# Patient Record
Sex: Male | Born: 1975 | Race: Black or African American | Hispanic: No | Marital: Single | Smoking: Former smoker
Health system: Southern US, Community
[De-identification: ages and names within clinical notes are randomized; demographics above are authoritative.]

## PROBLEM LIST (undated history)

## (undated) DIAGNOSIS — B019 Varicella without complication: Secondary | ICD-10-CM

## (undated) DIAGNOSIS — D649 Anemia, unspecified: Secondary | ICD-10-CM

## (undated) DIAGNOSIS — F209 Schizophrenia, unspecified: Secondary | ICD-10-CM

## (undated) DIAGNOSIS — E785 Hyperlipidemia, unspecified: Secondary | ICD-10-CM

## (undated) DIAGNOSIS — F1910 Other psychoactive substance abuse, uncomplicated: Secondary | ICD-10-CM

## (undated) DIAGNOSIS — F32A Depression, unspecified: Secondary | ICD-10-CM

## (undated) DIAGNOSIS — F329 Major depressive disorder, single episode, unspecified: Secondary | ICD-10-CM

## (undated) HISTORY — DX: Other psychoactive substance abuse, uncomplicated: F19.10

## (undated) HISTORY — DX: Hyperlipidemia, unspecified: E78.5

## (undated) HISTORY — DX: Anemia, unspecified: D64.9

## (undated) HISTORY — DX: Major depressive disorder, single episode, unspecified: F32.9

## (undated) HISTORY — DX: Varicella without complication: B01.9

## (undated) HISTORY — DX: Depression, unspecified: F32.A

## (undated) HISTORY — DX: Schizophrenia, unspecified: F20.9

## (undated) HISTORY — PX: CYST EXCISION: SHX5701

---

## 2014-03-21 ENCOUNTER — Ambulatory Visit (INDEPENDENT_AMBULATORY_CARE_PROVIDER_SITE_OTHER): Payer: Medicare FFS | Admitting: Family

## 2014-03-21 ENCOUNTER — Encounter: Payer: Self-pay | Admitting: Family

## 2014-03-21 ENCOUNTER — Telehealth: Payer: Self-pay | Admitting: Family

## 2014-03-21 ENCOUNTER — Other Ambulatory Visit (INDEPENDENT_AMBULATORY_CARE_PROVIDER_SITE_OTHER): Payer: Medicare FFS

## 2014-03-21 ENCOUNTER — Ambulatory Visit (INDEPENDENT_AMBULATORY_CARE_PROVIDER_SITE_OTHER)
Admission: RE | Admit: 2014-03-21 | Discharge: 2014-03-21 | Disposition: A | Discharge status: Home / Self Care | Payer: Medicare FFS | Source: Ambulatory Visit | Attending: Family | Admitting: Family

## 2014-03-21 VITALS — BP 130/90 | HR 63 | Temp 98.4°F | Ht 66.5 in | Wt 179.8 lb

## 2014-03-21 DIAGNOSIS — Z8042 Family history of malignant neoplasm of prostate: Secondary | ICD-10-CM

## 2014-03-21 DIAGNOSIS — M79672 Pain in left foot: Secondary | ICD-10-CM

## 2014-03-21 DIAGNOSIS — Z125 Encounter for screening for malignant neoplasm of prostate: Secondary | ICD-10-CM

## 2014-03-21 LAB — PSA: PSA: 0.47 ng/mL (ref 0.10–4.00)

## 2014-03-21 NOTE — Progress Notes (Signed)
Pre visit review using our clinic review tool, if applicable. No additional management support is needed unless otherwise documented below in the visit note. 

## 2014-03-21 NOTE — Telephone Encounter (Signed)
Please call to notify patient that his PSA results were within normal limits, so no further action is needed at this time. We will continue to monitor in future unless symptoms develop sooner. Also the x-rays of his foot were also normal showing no fracture. I have put in a referral to Sports Medicine as we discussed during his appointment. He should he back in about a week, if he does not, please let us know.

## 2014-03-21 NOTE — Progress Notes (Signed)
Subjective:    Patient ID: Justin Sanchez, male    DOB: 02/05/1976, 38 y.o.   MRN: [id number]  HPI  Justin Sanchez is a 38 y/o male who presents today to establish care.   1) Prostate Cancer: He recently found out from a family member that all the men in family have a history of prostate cancer and would like to have his PSA tested. Currently denies any urinary symptoms including urgency, frequency, changes in stream, or dribbling.   2) Foot - Indicates he was playing basketball a couple of weeks ago and a few days later noted left sided sharp dorsolateral foot pain. Pain has is severe enough at times when he gets out of bed to "drop him down." He feels it with most steps. Denies performing any treatments or taking any medications. Denies hearing/feeling any pops, snaps or sensations.    No Known Allergies  Current outpatient prescriptions:IRON PO, Take by mouth., Disp: , Rfl: ;  OLANZapine (ZYPREXA) 10 MG tablet, , Disp: , Rfl: ;  simvastatin (ZOCOR) 20 MG tablet, , Disp: , Rfl:   Past Medical History  Diagnosis Date  . Hyperlipidemia   . Chicken pox   . Schizophrenia     2002 - well controlled  . Anemia     Iron deficinecy - taking iron  . Depression     Long time ago - denies currently for 10 years  . Alcohol abuse     Sober since 2003  . Drug abuse     Family History  Problem Relation Age of Onset  . Prostate cancer Maternal Uncle     History   Social History  . Marital Status: Married    Spouse Name: [name]    Number of Children: 1  . Years of Education: 14   Occupational History  . Other     Works [employer]   Social History Main Topics  . Smoking status: Former Smoker -- 1.50 packs/day for 6 years  . Smokeless tobacco: Never Used  . Alcohol Use: No  . Drug Use: No  . Sexual Activity: Yes    Birth Control/ Protection: Condom   Other Topics Concern  . Not on file   Social History Narrative   Born in [place], [place]   Raised [place], [place]     Completed some college   Family brought to [facility]      Cycle and exercise, read,    Diet: eats a little bit of everything, stays away from fried food - eats lots of vegetables.    Review of Systems  Constitutional: Negative for fever and chills.  Respiratory: Negative for cough, chest tightness and shortness of breath.   Cardiovascular: Negative for chest pain, palpitations and leg swelling.  Genitourinary: Negative for urgency, frequency, decreased urine volume and difficulty urinating.  Musculoskeletal: Positive for arthralgias (Left foot pain as described above).  Psychiatric/Behavioral: Negative for suicidal ideas, self-injury and dysphoric mood. The patient is not nervous/anxious.       Objective:   Physical Exam  Constitutional: He appears well-developed and well-nourished. No distress.  Cardiovascular: Normal rate, regular rhythm and normal heart sounds.   Pulmonary/Chest: Effort normal and breath sounds normal.  Musculoskeletal:       Feet:  No obvious deformity or discoloration of left foot noted. Strength 5+ in all directions. Tuning fork negative. Ant drawer, talar tilt negative.          BP 130/90  Pulse 63  Temp(Src)  98.4 F (36.9 C) (Oral)  Ht 5' 6.5" (1.689 m)  Wt 179 lb 12.8 oz (81.557 kg)  BMI 28.59 kg/m2  SpO2 95%   Assessment & Plan:

## 2014-03-21 NOTE — Patient Instructions (Signed)
Thank you for choosing [facility].  Summary/Instructions:   Please stop by the lab prior to leaving to get your blood work  Please stop by radiology to have your foot x-ray completed  Plan to follow up in about a month for your annual physical exam.  Thank you for enrolling in MyChart. Please follow the instructions below to securely access your online medical record. MyChart allows you to send messages to your doctor, view your test results, renew your prescriptions, schedule appointments, and more.  How Do I Sign Up? 1. In your Internet browser, go to [email or web address]. 2. Click on the New  User? link in the Sign In box.  3. Enter your MyChart Access Code exactly as it appears below. You will not need to use this code after you have completed the sign-up process. If you do not sign up before the expiration date, you must request a new code. MyChart Access Code: [email or web address] Expires: 05/20/2014 11:33 AM  4. Enter the last four digits of your Social Security Number (xxxx) and Date of Birth (mm/dd/yyyy) as indicated and click Next. You will be taken to the next sign-up page. 5. Create a MyChart ID. This will be your MyChart login ID and cannot be changed, so think of one that is secure and easy to remember. 6. Create a MyChart password. You can change your password at any time. 7. Enter your Password Reset Question and Answer and click Next. This can be used at a later time if you forget your password.  8. Select your communication preference, and if applicable enter your e-mail address. You will receive e-mail notification when new information is available in MyChart by choosing to receive e-mail notifications and filling in your e-mail. 9. Click Sign In. You can now view your medical record.   Additional Information If you have questions, you can email [email or web address] or call [phone] to talk to our MyChart staff. Remember, MyChart is  NOT to be used for urgent needs. For medical emergencies, dial 911.   Prostate-Specific Antigen The prostate-specific antigen (PSA) test is a blood test. It can be used to help diagnose prostate cancer in men whose physical exam results suggest the presence of prostate cancer. Some factors interfere with the results of the PSA. The factors listed below will either increase or decrease the PSA levels. They are: Prescriptions used for male baldness. Some herbs. Active prostate infection. Prior instrumentation or urinary catheterization. Ejaculation up to 2 days prior to testing. A noncancerous enlargement of the prostate. Inflammation of the prostate. Active urinary tract infection. If your test results are high (elevated), your health care provider will discuss the results with you. He or she will also let you know if more evaluation is needed. PREPARATION FOR TEST No preparation or fasting is necessary. NORMAL FINDINGS Less than 4 ng/mL or less than 774mcg/L (SI units) Ranges for normal findings may vary among different labs and hospitals. You should always check with your health care provider after having lab work or other tests done to discuss the meaning of your test results and whether your values are considered within normal limits. MEANING OF TEST  A normal value means prostate cancer is less likely. The chance of having prostate cancer increases if the value is between 4 ng/mL and 10 ng/mL. However, further testing will be needed. Values above 10 ng/mL suggest that there is a much higher chance of having prostate cancer (if the above situations that raise PSA  are not present). Your health care provider will go over your test results with you and discuss the importance of this test. If this value is elevated, your health care provider may recommend further testing or evaluation. OBTAINING THE TEST RESULTS It is your responsibility to obtain your test results. Ask the lab or department  performing the test when and how you will get your results. Document Released: 07/05/2004 Document Revised: 06/07/2013 Document Reviewed: 01/08/2007 [facility] Patient Information 2015 [place], [place]. This information is not intended to replace advice given to you by your health care provider. Make sure you discuss any questions you have with your health care provider.    Prostate Cancer  Prostate cancer is the abnormal growth of cells in your prostate gland. Your prostate gland is involved in the production of semen. It is located below your bladder and in front of your rectum. A normal prostate gland is the size of a walnut and surrounds the tube that carries urine from the bladder (urethra).  RISK FACTORS  Age older than 65 years.  African American race.  Obesity.  Family history of prostate cancer.  Family history of breast cancer. SIGNS AND SYMPTOMS   Frequent urination.  Weak or interrupted flow of urine.  Difficulty starting or stopping urination.  Inability to urinate.  Painful or burning urination.  Painful ejaculation.  Blood in urine or semen.  Persistent pain or discomfort in the lower back, lower abdomen, hips, or upper thighs.  Difficulty getting an erection.  Difficulty emptying your bladder completely. DIAGNOSIS  Prostate cancer can be diagnosed by a digital rectal exam, prostate-specific antigen (PSA) blood test, transrectal ultrasonography, and then a biopsy to test a tissue sample. Usually 8-12 samples are taken. The tissue is sent to a specialist who looks at tissues and cells (pathologist). If cancer is diagnosed, the next step is to stage the cancer. This means it is put in a category based on how far the cancer has spread. This is important for helping your health care providers plan appropriate treatment. The different stages of prostate cancer are as follows:  Stage I. Cancer is found in the prostate only. It cannot be felt during a digital rectal  exam and is not visible by imaging. It is usually found accidentally, such as during surgery for other prostate problems.  Stage II. Cancer is more advanced than in stage I but has not spread outside the prostate.  Stage III. Cancer has spread beyond the outer layer of the prostate to nearby tissues. Cancer may be found in the seminal vesicles.  Stage IV. Cancer has spread to lymph nodes or to other parts of the body. The cancer may have spread to the bladder, rectum, bones, liver, or lungs. Prostate cancer often spreads to the bones. Imaging scans, such as a bone scan, CT, PET, or MRI, are done to help in the staging process. TREATMENT  Treatments such as surgery, medicines, and radiation may be recommended based on the stage of the cancer and other factors. Once cancer of the prostate has been diagnosed, your health care provider will discuss your treatment with you. Your health care provider will help you decide on the best course of treatment. Treatment often depends on your age, health, and other risk factors. The more common methods of treatment are:  Observation for early stage prostate cancer.  Open surgery. This involves a surgery to remove the prostate.  Laparoscopic prostatectomy to remove the prostate and lymph nodes.  Robotic prostatectomy to remove  the prostate and lymph nodes.  External beam radiation, which aims beams of radiation from outside the body at the prostate.  Internal radiation (brachytherapy), which uses radioactive needles, 40-100 pellets (seeds), wires, or catheters implanted directly into the prostate gland.  High-intensity, focused ultrasonography to destroy cancer cells.  Cryosurgery to freeze and destroy prostate cancer cells.  Chemotherapy medicines to stop the growth of cancer cells either by killing them or by stopping them from multiplying.  Hormone treatment. Medicines that stop your body from producing testosterone, or medicines that block  testosterone from reaching cancer cells.  Orchiectomy. This is surgery to remove your testicles. HOME CARE INSTRUCTIONS  Take medicines only as directed by your health care provider.  Maintain a healthy diet.  Get plenty of sleep.  Consider joining a support group. This may help you learn to cope with the stress of having prostate cancer.  Seek advice to help you manage treatment side effects.  Keep all follow-up visits as directed by your health care provider.  Inform your cancer specialist if you are admitted to the hospital.  Continue sexual expression. If you experience erectile dysfunction, your natural reaction will be to pull away and avoid all sexual contact. Consider touching, holding, hugging, and caressing as ways to continue sharing sexuality with your partner. SEEK MEDICAL CARE IF:  You have trouble urinating.  You have blood in your urine.  You have trouble getting an erection.  You have pain in your hips, back, or chest. SEEK IMMEDIATE MEDICAL CARE IF:  You have weakness or numbness in your legs.  You have involuntary loss of urine or stool (incontinence). Document Released: 06/02/2005 Document Revised: 10/17/2013 Document Reviewed: 11/19/2012 [facility] Patient Information 2015 [place], [place]. This information is not intended to replace advice given to you by your health care provider. Make sure you discuss any questions you have with your health care provider.

## 2014-03-21 NOTE — Assessment & Plan Note (Signed)
No obvious deformity / discoloration. Most likely foot sprain, cannot rule out fx. Obtain foot x-rays, NSAIDs, possible refer to Sports Medicine.

## 2014-03-21 NOTE — Assessment & Plan Note (Signed)
Denies any current signs/symptoms. Will obtain PSA. Information on prostate cancer and PSA test provided to patient.

## 2014-03-22 NOTE — Telephone Encounter (Signed)
lmtcb

## 2014-03-23 NOTE — Telephone Encounter (Signed)
Patient called for lab results, leave a detailed message on his phone.  Thank You

## 2014-03-23 NOTE — Telephone Encounter (Signed)
This patient was routed to me by mistake, please follow up or route to the appropriate person.  Thank you.

## 2014-03-23 NOTE — Telephone Encounter (Signed)
lmtcb

## 2014-03-23 NOTE — Telephone Encounter (Signed)
Pt.notified

## 2014-03-31 ENCOUNTER — Ambulatory Visit: Payer: Medicare FFS | Admitting: Family Medicine

## 2014-04-04 ENCOUNTER — Telehealth: Payer: Self-pay | Admitting: *Deleted

## 2014-04-04 NOTE — Telephone Encounter (Signed)
Pt wanting to get the actual value on his PSA. Gave him results...Raechel Chute/lmb

## 2014-04-05 ENCOUNTER — Ambulatory Visit (INDEPENDENT_AMBULATORY_CARE_PROVIDER_SITE_OTHER): Payer: Medicare FFS | Admitting: Family Medicine

## 2014-04-05 ENCOUNTER — Encounter: Payer: Self-pay | Admitting: Family Medicine

## 2014-04-05 ENCOUNTER — Other Ambulatory Visit (INDEPENDENT_AMBULATORY_CARE_PROVIDER_SITE_OTHER): Payer: Medicare FFS

## 2014-04-05 VITALS — BP 118/70 | HR 65 | Ht 66.0 in | Wt 178.0 lb

## 2014-04-05 DIAGNOSIS — M79672 Pain in left foot: Secondary | ICD-10-CM

## 2014-04-05 DIAGNOSIS — S92342A Displaced fracture of fourth metatarsal bone, left foot, initial encounter for closed fracture: Secondary | ICD-10-CM | POA: Insufficient documentation

## 2014-04-05 NOTE — Assessment & Plan Note (Signed)
Basal fourth metatarsal. We discussed icing protocol, Cam Walker, home exercises, as well as vitamin D supplementation and Tylenol for pain relief. Patient will avoid anti-inflammatories due to decreasing bone healing. Patient followup in 2 weeks to make sure that he continues to improve. We discussed having patient doing seated work only at this time patient will try to work in the [employer] Walker.

## 2014-04-05 NOTE — Progress Notes (Signed)
  Tawana ScaleZach Ilean Spradlin D.O. [facility] [address] [place], [place] [address] Phone: [phone] Subjective:    I'm seeing this patient by the request  of:  Jeanine Luzalone, Gregory, FNP   CC: left foot pain.   OZH:YQMVHQIONGHPI:Subjective Ines BloomerShawn Mayford KnifeWilliams is a 38 y.o. male coming in with complaint of left foot pain. Patient states he has had this pain for approximately 1 month. Patient does not remember any true injury but was playing basketball and had mild sensation in his left foot. Patient states that now the pain is increased at any time he is walking on the foot he is in pain. States it is on the dorsal lateral aspect of the foot. Denies any numbness or tingling. States that if he is not walking on it he feels better. States that the severity is 7/10. Denies any weakness of the foot or ankle. Has never injured this foot previously. 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 X-rays were reviewed by me. When in for any image patient does have a small extra-articular spiral fracture noted then may extend into the metatarsal joint. No significant displacement noted.    Past medical history, social, surgical and family history all reviewed in electronic medical record.   Review of Systems: No headache, visual changes, nausea, vomiting, diarrhea, constipation, dizziness, abdominal pain, skin rash, fevers, chills, night sweats, weight loss, swollen lymph nodes, body aches, joint swelling, muscle aches, chest pain, shortness of breath, mood changes.   Objective Blood pressure 118/70, pulse 65, height 5\' 6"  (1.676 m), weight 178 lb (80.74 kg), SpO2 97.00%.  General: No apparent distress alert and oriented x3 mood and affect normal, dressed appropriately.  HEENT: Pupils equal, extraocular movements intact  Respiratory: Patient's speak in full sentences and does not appear short of breath  Cardiovascular: No lower extremity edema, non tender, no erythema  Skin: Warm dry intact with no signs of  infection or rash on extremities or on axial skeleton.  Abdomen: Soft nontender  Neuro: Cranial nerves II through XII are intact, neurovascularly intact in all extremities with 2+ DTRs and 2+ pulses.  Lymph: No lymphadenopathy of posterior or anterior cervical chain or axillae bilaterally.  Gait normal with good balance and coordination.  MSK:  Non tender with full range of motion and good stability and symmetric strength and tone of shoulders, elbows, wrist, hip, knee and ankles bilaterally.  Foot exam shows no significant gross deformity. Patient is severely tender over the base of the fourth metatarsal. Patient is minimally tender over the base of the fifth metatarsal as well. No crepitus noted. Neurovascularly intact distally. Good capillary refill.  Muscular skeletal ultrasound was performed and interpreted by Antoine PrimasSMITH, Kirsi Hugh, M  Today.  Limited ultrasound of the base of the fourth metatarsal shows the patient does have a true fracture that is minimally displaced at the base of the fourth metatarsal that is extra-articular. Increasing Doppler flow as well as hypoechoic changes also noted.  Impression: Fourth metatarsal fracture noted.    Impression and Recommendations:     This case required medical decision making of moderate complexity.

## 2014-04-05 NOTE — Patient Instructions (Signed)
Good to see you Ice bath 20 minutes at night Tylenol 650mg  3 times daily.  Pennsaid up to 2 times daily if needed.  Vitamin D 2000 IU daily.  Rigid sole shoes until we get the boot and we will call you See you again in 2-3 weeks.

## 2014-04-06 ENCOUNTER — Telehealth: Payer: Self-pay | Admitting: *Deleted

## 2014-04-06 NOTE — Telephone Encounter (Signed)
Left msg on triage wanting to know is his boot ready for pick-up...Raechel Chute/lmb

## 2014-04-07 NOTE — Telephone Encounter (Signed)
Nothing yet.  We will keep him informed.

## 2014-04-07 NOTE — Telephone Encounter (Signed)
Notified pt with md response.../lmb 

## 2014-04-11 ENCOUNTER — Telehealth: Payer: Self-pay | Admitting: Family Medicine

## 2014-04-11 NOTE — Telephone Encounter (Signed)
Patient would like a call back in regards to a boot he is suppose to get but not gotten yet.

## 2014-04-11 NOTE — Telephone Encounter (Signed)
Spoke to pt, let him know that his boot is suppose to be in tomorrow, i told him i would call him as soon as it comes in.

## 2014-04-12 ENCOUNTER — Encounter: Payer: Self-pay | Admitting: *Deleted

## 2014-04-12 ENCOUNTER — Telehealth: Payer: Self-pay | Admitting: Family Medicine

## 2014-04-12 NOTE — Telephone Encounter (Signed)
Would like to know if boot came in.  He is requesting you leave vm on phone.

## 2014-04-12 NOTE — Telephone Encounter (Signed)
Left msg on pt's vmail to let him know that the boot is here & he can come by to pick it up.

## 2014-04-20 ENCOUNTER — Ambulatory Visit (INDEPENDENT_AMBULATORY_CARE_PROVIDER_SITE_OTHER): Payer: Medicare FFS | Admitting: Family Medicine

## 2014-04-20 ENCOUNTER — Encounter: Payer: Self-pay | Admitting: Family Medicine

## 2014-04-20 ENCOUNTER — Other Ambulatory Visit (INDEPENDENT_AMBULATORY_CARE_PROVIDER_SITE_OTHER): Payer: Medicare FFS

## 2014-04-20 VITALS — BP 132/78 | HR 71 | Ht 66.0 in | Wt 182.0 lb

## 2014-04-20 DIAGNOSIS — S92342A Displaced fracture of fourth metatarsal bone, left foot, initial encounter for closed fracture: Secondary | ICD-10-CM

## 2014-04-20 MED ORDER — VITAMIN D (ERGOCALCIFEROL) 1.25 MG (50000 UNIT) PO CAPS: 50000.0000 [IU] | ORAL_CAPSULE | ORAL | Status: DC

## 2014-04-20 NOTE — Progress Notes (Signed)
  Tawana ScaleZach Antar Sanchez D.O. [facility] [address] [place], [name] Phone: [phone] Subjective:     CC: left foot pain.   BJY:NWGNFAOZHYHPI:Subjective Justin Sanchez is a 38 y.o. male coming in with complaint of left foot pain. Patient is following up for a fourth metatarsal fracture. Patient is mostly wearing a [employer] Walker boot and he states he does not do it on a regular basis. Patient did not get any over-the-counter vitamin D supplementation either. Patient states that it is improving but slowly. States that he is still unable to work secondary to the pain being in the walker itself. Denies any new symptoms.    Past medical history, social, surgical and family history all reviewed in electronic medical record.   Review of Systems: No headache, visual changes, nausea, vomiting, diarrhea, constipation, dizziness, abdominal pain, skin rash, fevers, chills, night sweats, weight loss, swollen lymph nodes, body aches, joint swelling, muscle aches, chest pain, shortness of breath, mood changes.   Objective Blood pressure 132/78, pulse 71, height 5\' 6"  (1.676 m), weight 182 lb (82.555 kg), SpO2 97 %.  General: No apparent distress alert and oriented x3 mood and affect normal, dressed appropriately.  HEENT: Pupils equal, extraocular movements intact  Respiratory: Patient's speak in full sentences and does not appear short of breath  Cardiovascular: No lower extremity edema, non tender, no erythema  Skin: Warm dry intact with no signs of infection or rash on extremities or on axial skeleton.  Abdomen: Soft nontender  Neuro: Cranial nerves II through XII are intact, neurovascularly intact in all extremities with 2+ DTRs and 2+ pulses.  Lymph: No lymphadenopathy of posterior or anterior cervical chain or axillae bilaterally.  Gait normal with good balance and coordination.  MSK:  Non tender with full range of motion and good stability and symmetric strength and tone of shoulders, elbows, wrist,  hip, knee and ankles bilaterally.  Foot exam shows no significant gross deformity. Patient is tender over the base of the fourth metatarsal with mild improvement from previous exam. Patient is minimally tender over the base of the fifth metatarsal as well. No crepitus noted. Neurovascularly intact distally. Good capillary refill.  Muscular skeletal ultrasound was performed and interpreted by Justin Sanchez, Justin Sanchez, M  Today.  Limited ultrasound of the base of the fourth metatarsal shows the patient does have a true fracture that is no longer displaced, mild callus formation noted.     Impression: Fourth metatarsal fracture noted with moderate healing.     Impression and Recommendations:     This case required medical decision making of moderate complexity.

## 2014-04-20 NOTE — Assessment & Plan Note (Signed)
Patient has been somewhat noncompliant encourage him to wear the boot on a more regular basis. We discussed doing the icing as well and patient was given a prescription for weekly vitamin D supplementation to help with the healing. Patient though has been out of work and needs to return to work in the near future. Discussed with him the possibility of coming back in 2 weeks for further evaluation. Patient has more of a callus formation we may be able to get him back into a boot.

## 2014-04-20 NOTE — Patient Instructions (Signed)
Good to see you Ice is your friend Bonita QuinYou are doing well Vitamin D weekly for next 3 months.  Wear boot daily next 2 weeks.  Then transition to shoe in house if no pain.  Then would love to see you in 3-4 weeks to make sure  When coming in if you can get xray before, so come in 20 minutes early.

## 2014-04-24 ENCOUNTER — Encounter: Payer: Self-pay | Admitting: Family

## 2014-04-24 ENCOUNTER — Ambulatory Visit (INDEPENDENT_AMBULATORY_CARE_PROVIDER_SITE_OTHER): Payer: Medicare FFS | Admitting: Family

## 2014-04-24 VITALS — BP 114/72 | HR 70 | Temp 98.1°F | Resp 18 | Ht 66.0 in | Wt 183.4 lb

## 2014-04-24 DIAGNOSIS — E785 Hyperlipidemia, unspecified: Secondary | ICD-10-CM | POA: Insufficient documentation

## 2014-04-24 DIAGNOSIS — F209 Schizophrenia, unspecified: Secondary | ICD-10-CM | POA: Insufficient documentation

## 2014-04-24 DIAGNOSIS — F200 Paranoid schizophrenia: Secondary | ICD-10-CM

## 2014-04-24 MED ORDER — SIMVASTATIN 20 MG PO TABS: 20.0000 mg | ORAL_TABLET | Freq: Every day | ORAL | Status: DC

## 2014-04-24 MED ORDER — OLANZAPINE 10 MG PO TABS: 10.0000 mg | ORAL_TABLET | Freq: Every day | ORAL | Status: DC

## 2014-04-24 NOTE — Assessment & Plan Note (Signed)
Stable on Zyprexa. Continue Zyprexa 10 mg daily. Rx sent to pharmacy. Follow up in 6 months or if symptoms appear.

## 2014-04-24 NOTE — Assessment & Plan Note (Signed)
Stable on Zocor. Obtain lipid profile and hepatic panel when fasting. Continue current Zocor at 20 mg daily. Follow up pending lab results.

## 2014-04-24 NOTE — Patient Instructions (Addendum)
Thank you for choosing [facility].  Summary/Instructions:  Please stop by the lab in a fasting state at your convenience for your blood work.   Your prescriptions have been sent to your pharmacy.  Follow up in about 6 months or sooner if necessary.

## 2014-04-24 NOTE — Progress Notes (Signed)
   Subjective:    Patient ID: Justin HutchingShawn Tungate, male    DOB: 03/19/1976, 38 y.o.   MRN: [id number]  Chief Complaint  Patient presents with  . Follow-up    blood work?   HPI:  Justin HutchingShawn Mielke is a 38 y.o. male who presents today for follow up.  1) Schizophrenia - It continues to be under control and stable on the Zyprexa.  Wife believes he may have had some mania. No associated positive or negative symptoms of zyprexa noted.  2) Hyperlipidemia - currently maintained on simvastatin with no muscle aches or side effects.  No Known Allergies  Current Outpatient Prescriptions on File Prior to Visit  Medication Sig Dispense Refill  . IRON PO Take by mouth.    . OLANZapine (ZYPREXA) 10 MG tablet     . simvastatin (ZOCOR) 20 MG tablet     . Vitamin D, Ergocalciferol, (DRISDOL) 50000 UNITS CAPS capsule Take 1 capsule (50,000 Units total) by mouth every 7 (seven) days. 12 capsule 0   No current facility-administered medications on file prior to visit.   Past Medical History  Diagnosis Date  . Hyperlipidemia   . Chicken pox   . Schizophrenia     2002 - well controlled  . Anemia     Iron deficinecy - taking iron  . Depression     Long time ago - denies currently for 10 years  . Alcohol abuse     Sober since 2003  . Drug abuse     Review of Systems    See HPI  Objective:    BP 114/72 mmHg  Pulse 70  Temp(Src) 98.1 F (36.7 C) (Oral)  Resp 18  Ht 5\' 6"  (1.676 m)  Wt 183 lb 6.4 oz (83.19 kg)  BMI 29.62 kg/m2  SpO2 97% Nursing note and vital signs reviewed.  Physical Exam  Constitutional: He is oriented to person, place, and time. He appears well-developed and well-nourished. No distress.  Wearing a boot on his left foot.   Cardiovascular: Normal rate, regular rhythm, normal heart sounds and intact distal pulses.   Pulmonary/Chest: Effort normal and breath sounds normal.  Neurological: He is alert and oriented to person, place, and time.  Skin: Skin is warm and dry.    Psychiatric: He has a normal mood and affect. His behavior is normal. Judgment and thought content normal.      Assessment & Plan:

## 2014-04-24 NOTE — Progress Notes (Signed)
Pre visit review using our clinic review tool, if applicable. No additional management support is needed unless otherwise documented below in the visit note. 

## 2014-04-25 ENCOUNTER — Other Ambulatory Visit (INDEPENDENT_AMBULATORY_CARE_PROVIDER_SITE_OTHER): Payer: Medicare FFS

## 2014-04-25 ENCOUNTER — Telehealth: Payer: Self-pay | Admitting: Family

## 2014-04-25 DIAGNOSIS — E785 Hyperlipidemia, unspecified: Secondary | ICD-10-CM

## 2014-04-25 DIAGNOSIS — F200 Paranoid schizophrenia: Secondary | ICD-10-CM

## 2014-04-25 LAB — CBC
HCT: 40.1 % (ref 39.0–52.0)
Hemoglobin: 12.8 g/dL — ABNORMAL LOW (ref 13.0–17.0)
MCHC: 32 g/dL (ref 30.0–36.0)
MCV: 82 fl (ref 78.0–100.0)
Platelets: 217 10*3/uL (ref 150.0–400.0)
RBC: 4.89 Mil/uL (ref 4.22–5.81)
RDW: 14.3 % (ref 11.5–15.5)
WBC: 6.9 10*3/uL (ref 4.0–10.5)

## 2014-04-25 LAB — HEPATIC FUNCTION PANEL
ALBUMIN: 3.7 g/dL (ref 3.5–5.2)
ALK PHOS: 45 U/L (ref 39–117)
ALT: 36 U/L (ref 0–53)
AST: 28 U/L (ref 0–37)
BILIRUBIN DIRECT: 0.1 mg/dL (ref 0.0–0.3)
BILIRUBIN TOTAL: 1.1 mg/dL (ref 0.2–1.2)
Total Protein: 7.6 g/dL (ref 6.0–8.3)

## 2014-04-25 LAB — LIPID PANEL
CHOLESTEROL: 196 mg/dL (ref 0–200)
HDL: 30 mg/dL — ABNORMAL LOW (ref >39.00)
LDL Cholesterol: 148 mg/dL — ABNORMAL HIGH (ref 0–99)
NonHDL: 166
Total CHOL/HDL Ratio: 7
Triglycerides: 91 mg/dL (ref 0.0–149.0)
VLDL: 18.2 mg/dL (ref 0.0–40.0)

## 2014-04-25 LAB — TSH: TSH: 0.94 u[IU]/mL (ref 0.35–4.50)

## 2014-04-25 NOTE — Telephone Encounter (Signed)
Please call the patient and inform him that his blood work is mostly within the expected range. His LDL or bad cholesterol remains slightly elevated. Continue his current simvastatin and I would like him to follow up in about 6 months. If his cholesterol remains high we may need to increase his medication.

## 2014-04-26 NOTE — Telephone Encounter (Signed)
Let pt know that his LDL was elevated and to continue current simvastatin and follow up in 6 mo. He is aware that if there is no change that the dose could be increased.

## 2014-05-18 ENCOUNTER — Encounter: Payer: Self-pay | Admitting: *Deleted

## 2014-05-18 ENCOUNTER — Encounter: Payer: Self-pay | Admitting: Family Medicine

## 2014-05-18 ENCOUNTER — Ambulatory Visit (INDEPENDENT_AMBULATORY_CARE_PROVIDER_SITE_OTHER)
Admission: RE | Admit: 2014-05-18 | Discharge: 2014-05-18 | Disposition: A | Discharge status: Home / Self Care | Payer: Medicare FFS | Source: Ambulatory Visit | Attending: Family Medicine | Admitting: Family Medicine

## 2014-05-18 ENCOUNTER — Ambulatory Visit (INDEPENDENT_AMBULATORY_CARE_PROVIDER_SITE_OTHER): Payer: Medicare FFS | Admitting: Family Medicine

## 2014-05-18 VITALS — BP 102/72 | HR 65 | Ht 66.0 in | Wt 181.0 lb

## 2014-05-18 DIAGNOSIS — S92342A Displaced fracture of fourth metatarsal bone, left foot, initial encounter for closed fracture: Secondary | ICD-10-CM

## 2014-05-18 NOTE — Assessment & Plan Note (Signed)
Mostly healing at this time.  X-rays were ordered reviewed and interpreted by me today. No x-rays show any type of bony abnormality.  Patient at this time should be considered completely healed. At this point patient will start increasing his activity. Patient will come back if there is any other type of symptoms. With patient being on the Zyprexa I would consider continuing the vitamin D supplementation. Patient and will come back and see me on an as-needed basis as long as the pain does not return.

## 2014-05-18 NOTE — Progress Notes (Signed)
  Justin Sanchez D.O. [facility] [address] [place], [name] Phone: [phone] Subjective:     CC: left foot pain.   BJY:NWGNFAOZHYHPI:Subjective Justin Sanchez is a 38 y.o. male coming in with complaint of left foot pain. Patient is following up for a fourth metatarsal fracture. Patient has now been an issue for a longer time. Patient did start the vitamin D a very high dose. Overall patient has noticed improvement. Patient can go through his day without any significant pain. Mild dull aching pain at the end of the day though. Denies any nighttime awakening. Patient's goal is to get back to cycling.    Past medical history, social, surgical and family history all reviewed in electronic medical record.   Review of Systems: No headache, visual changes, nausea, vomiting, diarrhea, constipation, dizziness, abdominal pain, skin rash, fevers, chills, night sweats, weight loss, swollen lymph nodes, body aches, joint swelling, muscle aches, chest pain, shortness of breath, mood changes.   Objective Blood pressure 102/72, pulse 65, height 5\' 6"  (1.676 m), weight 181 lb (82.101 kg), SpO2 98 %.  General: No apparent distress alert and oriented x3 mood and affect normal, dressed appropriately.  HEENT: Pupils equal, extraocular movements intact  Respiratory: Patient's speak in full sentences and does not appear short of breath  Cardiovascular: No lower extremity edema, non tender, no erythema  Skin: Warm dry intact with no signs of infection or rash on extremities or on axial skeleton.  Abdomen: Soft nontender  Neuro: Cranial nerves II through XII are intact, neurovascularly intact in all extremities with 2+ DTRs and 2+ pulses.  Lymph: No lymphadenopathy of posterior or anterior cervical chain or axillae bilaterally.  Gait normal with good balance and coordination.  MSK:  Non tender with full range of motion and good stability and symmetric strength and tone of shoulders, elbows, wrist, hip,  knee and ankles bilaterally.  Foot exam shows no significant gross deformity. Patient is no longer tender on exam.  Muscular skeletal ultrasound was performed and interpreted by Antoine PrimasSMITH, ZACHARY, M  Today.  Limited ultrasound of the base of the fourth metatarsal shows the patient does have a true fracture that is no longer displaced, With near complete healing at this time.     Impression: continued healing noted.      Impression and Recommendations:     This case required medical decision making of moderate complexity.

## 2014-05-18 NOTE — Patient Instructions (Signed)
You are good to go Keep the rubber down Ice after riding.  Do what you can and continue the vitamin D  See me when you need me.

## 2014-05-26 ENCOUNTER — Encounter: Payer: Self-pay | Admitting: Family

## 2014-05-26 ENCOUNTER — Other Ambulatory Visit: Payer: Self-pay | Admitting: Family

## 2014-06-03 ENCOUNTER — Encounter: Payer: Self-pay | Admitting: Family

## 2014-06-05 ENCOUNTER — Other Ambulatory Visit: Payer: Self-pay

## 2014-06-05 MED ORDER — VALACYCLOVIR HCL 500 MG PO TABS
500.0000 mg | ORAL_TABLET | Freq: Two times a day (BID) | ORAL | Status: DC
Start: 2014-06-05 — End: 2017-05-29

## 2014-07-20 ENCOUNTER — Emergency Department (HOSPITAL_COMMUNITY)
Admission: EM | Admit: 2014-07-20 | Discharge: 2014-07-20 | Disposition: A | Discharge status: Home / Self Care | Payer: No Typology Code available for payment source | Attending: Emergency Medicine | Admitting: Emergency Medicine

## 2014-07-20 ENCOUNTER — Emergency Department (HOSPITAL_COMMUNITY): Payer: No Typology Code available for payment source

## 2014-07-20 ENCOUNTER — Encounter (HOSPITAL_COMMUNITY): Payer: Self-pay | Admitting: *Deleted

## 2014-07-20 DIAGNOSIS — Y9389 Activity, other specified: Secondary | ICD-10-CM | POA: Insufficient documentation

## 2014-07-20 DIAGNOSIS — S0990XA Unspecified injury of head, initial encounter: Secondary | ICD-10-CM | POA: Insufficient documentation

## 2014-07-20 DIAGNOSIS — F329 Major depressive disorder, single episode, unspecified: Secondary | ICD-10-CM | POA: Diagnosis not present

## 2014-07-20 DIAGNOSIS — S99922A Unspecified injury of left foot, initial encounter: Secondary | ICD-10-CM | POA: Diagnosis present

## 2014-07-20 DIAGNOSIS — E785 Hyperlipidemia, unspecified: Secondary | ICD-10-CM | POA: Diagnosis not present

## 2014-07-20 DIAGNOSIS — Z87891 Personal history of nicotine dependence: Secondary | ICD-10-CM | POA: Diagnosis not present

## 2014-07-20 DIAGNOSIS — D649 Anemia, unspecified: Secondary | ICD-10-CM | POA: Diagnosis not present

## 2014-07-20 DIAGNOSIS — Y92410 Unspecified street and highway as the place of occurrence of the external cause: Secondary | ICD-10-CM | POA: Diagnosis not present

## 2014-07-20 DIAGNOSIS — S6991XA Unspecified injury of right wrist, hand and finger(s), initial encounter: Secondary | ICD-10-CM | POA: Diagnosis not present

## 2014-07-20 DIAGNOSIS — Z8619 Personal history of other infectious and parasitic diseases: Secondary | ICD-10-CM | POA: Diagnosis not present

## 2014-07-20 DIAGNOSIS — F209 Schizophrenia, unspecified: Secondary | ICD-10-CM | POA: Diagnosis not present

## 2014-07-20 DIAGNOSIS — Z79899 Other long term (current) drug therapy: Secondary | ICD-10-CM | POA: Insufficient documentation

## 2014-07-20 DIAGNOSIS — Y998 Other external cause status: Secondary | ICD-10-CM | POA: Diagnosis not present

## 2014-07-20 MED ORDER — IBUPROFEN 800 MG PO TABS: 800.0000 mg | ORAL_TABLET | Freq: Three times a day (TID) | ORAL | Status: DC

## 2014-07-20 MED ORDER — METHOCARBAMOL 500 MG PO TABS: 500.0000 mg | ORAL_TABLET | Freq: Two times a day (BID) | ORAL | Status: DC

## 2014-07-20 NOTE — Discharge Instructions (Signed)
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
## 2014-07-27 ENCOUNTER — Ambulatory Visit (INDEPENDENT_AMBULATORY_CARE_PROVIDER_SITE_OTHER): Payer: Medicare FFS | Admitting: Family

## 2014-07-27 ENCOUNTER — Encounter: Payer: Self-pay | Admitting: Family

## 2014-07-27 DIAGNOSIS — M25531 Pain in right wrist: Secondary | ICD-10-CM

## 2014-07-27 NOTE — Patient Instructions (Signed)
Thank you for choosing [facility].  Summary/Instructions:  If your symptoms worsen or fail to improve, please contact our office for further instruction, or in case of emergency go directly to the emergency room at the closest medical facility.    Wrist Sprain with Rehab A sprain is an injury in which a ligament that maintains the proper alignment of a joint is partially or completely torn. The ligaments of the wrist are susceptible to sprains. Sprains are classified into three categories. Grade 1 sprains cause pain, but the tendon is not lengthened. Grade 2 sprains include a lengthened ligament because the ligament is stretched or partially ruptured. With grade 2 sprains there is still function, although the function may be diminished. Grade 3 sprains are characterized by a complete tear of the tendon or muscle, and function is usually impaired. SYMPTOMS   Pain tenderness, inflammation, and/or bruising (contusion) of the injury.  A "pop" or tear felt and/or heard at the time of injury.  Decreased wrist function. CAUSES  A wrist sprain occurs when a force is placed on one or more ligaments that is greater than it/they can withstand. Common mechanisms of injury include:  Catching a ball with you hands.  Repetitive and/ or strenuous extension or flexion of the wrist. RISK INCREASES WITH:  Previous wrist injury.  Contact sports (boxing or wrestling).  Activities in which falling is common.  Poor strength and flexibility.  Improperly fitted or padded protective equipment. PREVENTION  Warm up and stretch properly before activity.  Allow for adequate recovery between workouts.  Maintain physical fitness:  Strength, flexibility, and endurance.  Cardiovascular fitness.  Protect the wrist joint by limiting its motion with the use of taping, braces, or splints.  Protect the wrist after injury for 6 to 12 months. PROGNOSIS  The prognosis for wrist sprains depends on the  degree of injury. Grade 1 sprains require 2 to 6 weeks of treatment. Grade 2 sprains require 6 to 8 weeks of treatment, and grade 3 sprains require up to 12 weeks.  RELATED COMPLICATIONS   Prolonged healing time, if improperly treated or re-injured.  Recurrent symptoms that result in a chronic problem.  Injury to nearby structures (bone, cartilage, nerves, or tendons).  Arthritis of the wrist.  Inability to compete in athletics at a high level.  Wrist stiffness or weakness.  Progression to a complete rupture of the ligament. TREATMENT  Treatment initially involves resting from any activities that aggravate the symptoms, and the use of ice and medications to help reduce pain and inflammation. Your caregiver may recommend immobilizing the wrist for a period of time in order to reduce stress on the ligament and allow for healing. After immobilization it is important to perform strengthening and stretching exercises to help regain strength and a full range of motion. These exercises may be completed at home or with a therapist. Surgery is not usually required for wrist sprains, unless the ligament has been ruptured (grade 3 sprain). MEDICATION   If pain medication is necessary, then nonsteroidal anti-inflammatory medications, such as aspirin and ibuprofen, or other minor pain relievers, such as acetaminophen, are often recommended.  Do not take pain medication for 7 days before surgery.  Prescription pain relievers may be given if deemed necessary by your caregiver. Use only as directed and only as much as you need. HEAT AND COLD  Cold treatment (icing) relieves pain and reduces inflammation. Cold treatment should be applied for 10 to 15 minutes every 2 to 3 hours for  inflammation and pain and immediately after any activity that aggravates your symptoms. Use ice packs or massage the area with a piece of ice (ice massage).  Heat treatment may be used prior to performing the stretching and  strengthening activities prescribed by your caregiver, physical therapist, or athletic trainer. Use a heat pack or soak your injury in warm water. SEEK MEDICAL CARE IF:  Treatment seems to offer no benefit, or the condition worsens.  Any medications produce adverse side effects. EXERCISES RANGE OF MOTION (ROM) AND STRETCHING EXERCISES - Wrist Sprain  These exercises may help you when beginning to rehabilitate your injury. Your symptoms may resolve with or without further involvement from your physician, physical therapist or athletic trainer. While completing these exercises, remember:   Restoring tissue flexibility helps normal motion to return to the joints. This allows healthier, less painful movement and activity.  An effective stretch should be held for at least 30 seconds.  A stretch should never be painful. You should only feel a gentle lengthening or release in the stretched tissue. RANGE OF MOTION - Wrist Flexion, Active-Assisted  Extend your right / left elbow with your fingers pointing down.*  Gently pull the back of your hand towards you until you feel a gentle stretch on the top of your forearm.  Hold this position for __________ seconds. Repeat __________ times. Complete this exercise __________ times per day.  *If directed by your physician, physical therapist or athletic trainer, complete this stretch with your elbow bent rather than extended. RANGE OF MOTION - Wrist Extension, Active-Assisted  Extend your right / left elbow and turn your palm upwards.*  Gently pull your palm/fingertips back so your wrist extends and your fingers point more toward the ground.  You should feel a gentle stretch on the inside of your forearm.  Hold this position for __________ seconds. Repeat __________ times. Complete this exercise __________ times per day. *If directed by your physician, physical therapist or athletic trainer, complete this stretch with your elbow bent, rather than  extended. RANGE OF MOTION - Supination, Active  Stand or sit with your elbows at your side. Bend your right / left elbow to 90 degrees.  Turn your palm upward until you feel a gentle stretch on the inside of your forearm.  Hold this position for __________ seconds. Slowly release and return to the starting position. Repeat __________ times. Complete this stretch __________ times per day.  RANGE OF MOTION - Pronation, Active  Stand or sit with your elbows at your side. Bend your right / left elbow to 90 degrees.  Turn your palm downward until you feel a gentle stretch on the top of your forearm.  Hold this position for __________ seconds. Slowly release and return to the starting position. Repeat __________ times. Complete this stretch __________ times per day.  STRETCH - Wrist Flexion  Place the back of your right / left hand on a tabletop leaving your elbow slightly bent. Your fingers should point away from your body.  Gently press the back of your hand down onto the table by straightening your elbow. You should feel a stretch on the top of your forearm.  Hold this position for __________ seconds. Repeat __________ times. Complete this stretch __________ times per day.  STRETCH - Wrist Extension  Place your right / left fingertips on a tabletop leaving your elbow slightly bent. Your fingers should point backwards.  Gently press your fingers and palm down onto the table by straightening your elbow. You should  feel a stretch on the inside of your forearm.  Hold this position for __________ seconds. Repeat __________ times. Complete this stretch __________ times per day.  STRENGTHENING EXERCISES - Wrist Sprain These exercises may help you when beginning to rehabilitate your injury. They may resolve your symptoms with or without further involvement from your physician, physical therapist or athletic trainer. While completing these exercises, remember:   Muscles can gain both the  endurance and the strength needed for everyday activities through controlled exercises.  Complete these exercises as instructed by your physician, physical therapist or athletic trainer. Progress with the resistance and repetition exercises only as your caregiver advises. STRENGTH - Wrist Flexors  Sit with your right / left forearm palm-up and fully supported. Your elbow should be resting below the height of your shoulder. Allow your wrist to extend over the edge of the surface.  Loosely holding a __________ weight or a piece of rubber exercise band/tubing, slowly curl your hand up toward your forearm.  Hold this position for __________ seconds. Slowly lower the wrist back to the starting position in a controlled manner. Repeat __________ times. Complete this exercise __________ times per day.  STRENGTH - Wrist Extensors  Sit with your right / left forearm palm-down and fully supported. Your elbow should be resting below the height of your shoulder. Allow your wrist to extend over the edge of the surface.  Loosely holding a __________ weight or a piece of rubber exercise band/tubing, slowly curl your hand up toward your forearm.  Hold this position for __________ seconds. Slowly lower the wrist back to the starting position in a controlled manner. Repeat __________ times. Complete this exercise __________ times per day.  STRENGTH - Ulnar Deviators  Stand with a ____________________ weight in your right / left hand, or sit holding on to the rubber exercise band/tubing with your opposite arm supported.  Move your wrist so that your pinkie travels toward your forearm and your thumb moves away from your forearm.  Hold this position for __________ seconds and then slowly lower the wrist back to the starting position. Repeat __________ times. Complete this exercise __________ times per day STRENGTH - Radial Deviators  Stand with a ____________________ weight in your  right / left hand, or  sit holding on to the rubber exercise band/tubing with your arm supported.  Raise your hand upward in front of you or pull up on the rubber tubing.  Hold this position for __________ seconds and then slowly lower the wrist back to the starting position. Repeat __________ times. Complete this exercise __________ times per day. STRENGTH - Forearm Supinators  Sit with your right / left forearm supported on a table, keeping your elbow below shoulder height. Rest your hand over the edge, palm down.  Gently grip a hammer or a soup ladle.  Without moving your elbow, slowly turn your palm and hand upward to a "thumbs-up" position.  Hold this position for __________ seconds. Slowly return to the starting position. Repeat __________ times. Complete this exercise __________ times per day.  STRENGTH - Forearm Pronators  Sit with your right / left forearm supported on a table, keeping your elbow below shoulder height. Rest your hand over the edge, palm up.    Gently grip a hammer or a soup ladle.  Without moving your elbow, slowly turn your palm and hand upward to a "thumbs-up" position.  Hold this position for __________ seconds. Slowly return to the starting position. Repeat __________ times. Complete this exercise __________  times per day.  STRENGTH - Grip  Grasp a tennis ball, a dense sponge, or a large, rolled sock in your hand.  Squeeze as hard as you can without increasing any pain.  Hold this position for __________ seconds. Release your grip slowly. Repeat __________ times. Complete this exercise __________ times per day.  Document Released: 06/02/2005 Document Revised: 08/25/2011 Document Reviewed: 09/14/2008 [facility] Patient Information 2015 [place], [place]. This information is not intended to replace advice given to you by your health care provider. Make sure you discuss any questions you have with your health care provider.  Carpal Tunnel Syndrome The carpal tunnel is a narrow  area located on the palm side of your wrist. The tunnel is formed by the wrist bones and ligaments. Nerves, blood vessels, and tendons pass through the carpal tunnel. Repeated wrist motion or certain diseases may cause swelling within the tunnel. This swelling pinches the main nerve in the wrist (median nerve) and causes the painful hand and arm condition called carpal tunnel syndrome. CAUSES   Repeated wrist motions.  Wrist injuries.  Certain diseases like arthritis, diabetes, alcoholism, hyperthyroidism, and kidney failure.  Obesity.  Pregnancy. SYMPTOMS   A "pins and needles" feeling in your fingers or hand, especially in your thumb, index and middle fingers.  Tingling or numbness in your fingers or hand.  An aching feeling in your entire arm, especially when your wrist and elbow are bent for long periods of time.  Wrist pain that goes up your arm to your shoulder.  Pain that goes down into your palm or fingers.  A weak feeling in your hands. DIAGNOSIS  Your health care provider will take your history and perform a physical exam. An electromyography test may be needed. This test measures electrical signals sent out by your nerves into the muscles. The electrical signals are usually slowed by carpal tunnel syndrome. You may also need X-rays. TREATMENT  Carpal tunnel syndrome may clear up by itself. Your health care provider may recommend a wrist splint or medicine such as a nonsteroidal anti-inflammatory medicine. Cortisone injections may help. Sometimes, surgery may be needed to free the pinched nerve.  HOME CARE INSTRUCTIONS   Take all medicine as directed by your health care provider. Only take over-the-counter or prescription medicines for pain, discomfort, or fever as directed by your health care provider.  If you were given a splint to keep your wrist from bending, wear it as directed. It is important to wear the splint at night. Wear the splint for as long as you have pain  or numbness in your hand, arm, or wrist. This may take 1 to 2 months.  Rest your wrist from any activity that may be causing your pain. If your symptoms are work-related, you may need to talk to your employer about changing to a job that does not require using your wrist.  Put ice on your wrist after long periods of wrist activity.  Put ice in a plastic bag.  Place a towel between your skin and the bag.  Leave the ice on for 15-20 minutes, 03-04 times a day.  Keep all follow-up visits as directed by your health care provider. This includes any orthopedic referrals, physical therapy, and rehabilitation. Any delay in getting necessary care could result in a delay or failure of your condition to heal. SEEK IMMEDIATE MEDICAL CARE IF:   You have new, unexplained symptoms.  Your symptoms get worse and are not helped or controlled with medicines. MAKE SURE YOU:  Understand these instructions.  Will watch your condition.  Will get help right away if you are not doing well or get worse. Document Released: 05/30/2000 Document Revised: 10/17/2013 Document Reviewed: 04/18/2011 [facility] Patient Information 2015 [place]. This information is not intended to replace advice given to you by your health care provider. Make sure you discuss any questions you have with your health care provider.

## 2014-07-27 NOTE — Progress Notes (Signed)
Pre visit review using our clinic review tool, if applicable. No additional management support is needed unless otherwise documented below in the visit note. 

## 2014-07-27 NOTE — Assessment & Plan Note (Signed)
Symptoms and exam of right wrist consistent with a sprain/strain as well as possible traumatic induced carpal tunnel syndrome. Continue ibuprofen as needed for inflammation. Obtain cockup wrist splint for protection and support.  Symptoms and exam of left foot consistent with healing fracture with possible reinjury. No new fracture noted. Recommend supportive shoe and/or Cam Walker as needed for support.  Follow up if symptoms worsen or fail to improve.
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
## 2014-07-28 ENCOUNTER — Telehealth: Payer: Self-pay

## 2014-07-28 NOTE — Telephone Encounter (Signed)
Let pt know that his paper work was here. He stated that he would not be able to come by to pick it up today but he will whenever he is out this way. Will hold on to the paper work until he is able to come and get it.

## 2014-08-08 ENCOUNTER — Telehealth: Payer: Self-pay | Admitting: Family

## 2014-08-08 MED ORDER — OLANZAPINE 10 MG PO TABS: 10.0000 mg | ORAL_TABLET | Freq: Every day | ORAL | Status: DC

## 2014-08-08 NOTE — Telephone Encounter (Signed)
Patient would like his OLANZAPINE prescription to be called into the pharmacy on an emergency basis because he is out of medication and is having insurance issues.

## 2014-08-08 NOTE — Telephone Encounter (Signed)
Spoke with pt and he stated that he owes a deductible to his insurance right now and he can't get anymore medicine period through his insurance. He was pretty much asking for a "free" supply of medication to last him for the next 15 days until social security helps pay his deductible. I explained to him that all we can do is send in the medication and he would have to take the pricing up with the pharmacy because we have no control over how much medications cost. Pt stated that he would try to go to the pharmacy and work something out with him because he states he NEEDS his medication.

## 2014-08-08 NOTE — Telephone Encounter (Signed)
Please inform the patient it is done.

## 2014-10-09 MED ORDER — TRAMADOL HCL 50 MG PO TABS: 50.0000 mg | ORAL_TABLET | Freq: Three times a day (TID) | ORAL | Status: DC | PRN

## 2014-10-23 ENCOUNTER — Ambulatory Visit: Payer: Medicare HMO | Admitting: Family

## 2014-11-27 ENCOUNTER — Ambulatory Visit: Payer: Medicare HMO | Admitting: Family

## 2014-11-28 ENCOUNTER — Encounter: Payer: Self-pay | Admitting: Family

## 2014-11-28 ENCOUNTER — Ambulatory Visit (INDEPENDENT_AMBULATORY_CARE_PROVIDER_SITE_OTHER): Payer: Medicare HMO | Admitting: Family

## 2014-11-28 ENCOUNTER — Other Ambulatory Visit (INDEPENDENT_AMBULATORY_CARE_PROVIDER_SITE_OTHER): Payer: Medicare HMO

## 2014-11-28 VITALS — BP 122/82 | HR 63 | Temp 98.2°F | Resp 14 | Ht 66.0 in | Wt 184.0 lb

## 2014-11-28 DIAGNOSIS — M25531 Pain in right wrist: Secondary | ICD-10-CM | POA: Diagnosis not present

## 2014-11-28 DIAGNOSIS — E785 Hyperlipidemia, unspecified: Secondary | ICD-10-CM | POA: Diagnosis not present

## 2014-11-28 LAB — BASIC METABOLIC PANEL
BUN: 7 mg/dL (ref 6–23)
CALCIUM: 9.4 mg/dL (ref 8.4–10.5)
CO2: 29 meq/L (ref 19–32)
CREATININE: 1.2 mg/dL (ref 0.40–1.50)
Chloride: 104 mEq/L (ref 96–112)
GFR: 87.25 mL/min (ref >60.00)
GLUCOSE: 98 mg/dL (ref 70–99)
Potassium: 4.7 mEq/L (ref 3.5–5.1)
SODIUM: 138 meq/L (ref 135–145)

## 2014-11-28 LAB — LIPID PANEL
CHOLESTEROL: 169 mg/dL (ref 0–200)
HDL: 33.2 mg/dL — ABNORMAL LOW (ref >39.00)
LDL Cholesterol: 108 mg/dL — ABNORMAL HIGH (ref 0–99)
NONHDL: 135.8
Total CHOL/HDL Ratio: 5
Triglycerides: 137 mg/dL (ref 0.0–149.0)
VLDL: 27.4 mg/dL (ref 0.0–40.0)

## 2014-11-28 NOTE — Patient Instructions (Signed)
Thank you for choosing [facility].  Summary/Instructions:   Please stop by the lab on the basement level of the building for your blood work. Your results will be released to MyChart (or called to you) after review, usually within 72 hours after test completion. If any changes need to be made, you will be notified at that same time.  Referrals have been made during this visit. You should expect to hear back from our schedulers in about 7-10 days in regards to establishing an appointment with the specialists we discussed.   If your symptoms worsen or fail to improve, please contact our office for further instruction, or in case of emergency go directly to the emergency room at the closest medical facility.        

## 2014-11-28 NOTE — Assessment & Plan Note (Signed)
Previously noted to have decreased HDL levels. Also currently maintained on olanzapine for his schizophrenia. Obtain basic metabolic panel and lipid profile to recheck current status. Continue current dose of olanzapine. Follow-up/changes pending blood work results.
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
## 2014-11-28 NOTE — Progress Notes (Signed)
Pre visit review using our clinic review tool, if applicable. No additional management support is needed unless otherwise documented below in the visit note. 
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
## 2015-02-05 ENCOUNTER — Other Ambulatory Visit: Payer: Self-pay | Admitting: Family

## 2015-03-23 ENCOUNTER — Ambulatory Visit: Payer: Medicare HMO

## 2015-04-16 ENCOUNTER — Other Ambulatory Visit: Payer: Self-pay

## 2015-04-16 MED ORDER — SIMVASTATIN 20 MG PO TABS: 20.0000 mg | ORAL_TABLET | Freq: Every day | ORAL | Status: DC

## 2015-05-07 DIAGNOSIS — M67431 Ganglion, right wrist: Secondary | ICD-10-CM | POA: Diagnosis not present

## 2015-05-07 DIAGNOSIS — M25531 Pain in right wrist: Secondary | ICD-10-CM | POA: Diagnosis not present

## 2015-05-07 DIAGNOSIS — S63501D Unspecified sprain of right wrist, subsequent encounter: Secondary | ICD-10-CM | POA: Diagnosis not present

## 2015-06-11 ENCOUNTER — Encounter: Payer: Self-pay | Admitting: Family

## 2015-06-13 MED ORDER — OLANZAPINE 5 MG PO TABS: 5.0000 mg | ORAL_TABLET | Freq: Every day | ORAL | Status: DC

## 2015-06-22 ENCOUNTER — Encounter: Payer: Self-pay | Admitting: Family

## 2015-06-26 ENCOUNTER — Encounter: Payer: Self-pay | Admitting: Family

## 2015-06-27 ENCOUNTER — Encounter: Payer: Self-pay | Admitting: Family

## 2015-06-29 ENCOUNTER — Encounter: Payer: Self-pay | Admitting: Family

## 2015-07-02 DIAGNOSIS — S63501D Unspecified sprain of right wrist, subsequent encounter: Secondary | ICD-10-CM | POA: Diagnosis not present

## 2015-07-02 DIAGNOSIS — M67431 Ganglion, right wrist: Secondary | ICD-10-CM | POA: Diagnosis not present

## 2015-07-02 DIAGNOSIS — M25531 Pain in right wrist: Secondary | ICD-10-CM | POA: Diagnosis not present

## 2015-08-22 DIAGNOSIS — M25531 Pain in right wrist: Secondary | ICD-10-CM | POA: Diagnosis not present

## 2015-08-22 DIAGNOSIS — S63501D Unspecified sprain of right wrist, subsequent encounter: Secondary | ICD-10-CM | POA: Diagnosis not present

## 2015-08-22 DIAGNOSIS — M67431 Ganglion, right wrist: Secondary | ICD-10-CM | POA: Diagnosis not present

## 2015-08-31 ENCOUNTER — Encounter: Payer: Self-pay | Admitting: Family

## 2015-09-19 DIAGNOSIS — M67431 Ganglion, right wrist: Secondary | ICD-10-CM | POA: Diagnosis not present

## 2015-09-19 DIAGNOSIS — S63501D Unspecified sprain of right wrist, subsequent encounter: Secondary | ICD-10-CM | POA: Diagnosis not present

## 2015-09-19 DIAGNOSIS — M25531 Pain in right wrist: Secondary | ICD-10-CM | POA: Diagnosis not present

## 2015-10-30 DIAGNOSIS — M65841 Other synovitis and tenosynovitis, right hand: Secondary | ICD-10-CM | POA: Diagnosis not present

## 2015-10-30 DIAGNOSIS — M67431 Ganglion, right wrist: Secondary | ICD-10-CM | POA: Diagnosis not present

## 2015-10-30 DIAGNOSIS — M659 Synovitis and tenosynovitis, unspecified: Secondary | ICD-10-CM | POA: Diagnosis not present

## 2015-11-07 DIAGNOSIS — M25531 Pain in right wrist: Secondary | ICD-10-CM | POA: Diagnosis not present

## 2015-11-07 DIAGNOSIS — M67431 Ganglion, right wrist: Secondary | ICD-10-CM | POA: Diagnosis not present

## 2015-11-07 DIAGNOSIS — S63501D Unspecified sprain of right wrist, subsequent encounter: Secondary | ICD-10-CM | POA: Diagnosis not present

## 2015-11-14 DIAGNOSIS — M25531 Pain in right wrist: Secondary | ICD-10-CM | POA: Diagnosis not present

## 2015-11-20 DIAGNOSIS — M25531 Pain in right wrist: Secondary | ICD-10-CM | POA: Diagnosis not present

## 2015-11-22 DIAGNOSIS — M25531 Pain in right wrist: Secondary | ICD-10-CM | POA: Diagnosis not present

## 2015-12-03 ENCOUNTER — Other Ambulatory Visit: Payer: Self-pay | Admitting: Family

## 2015-12-03 NOTE — Telephone Encounter (Signed)
Last refill 06/12/16

## 2015-12-20 DIAGNOSIS — M25531 Pain in right wrist: Secondary | ICD-10-CM | POA: Diagnosis not present

## 2015-12-20 IMAGING — CR DG FOOT COMPLETE 3+V*L*
3 series · 3 of 3 positions shown · non-contrast
Comparison: None.

CLINICAL DATA: Mid foot pain for 2 weeks, no injury

EXAM:
LEFT FOOT - COMPLETE 3+ VIEW

[view not recorded (1 of 3)]
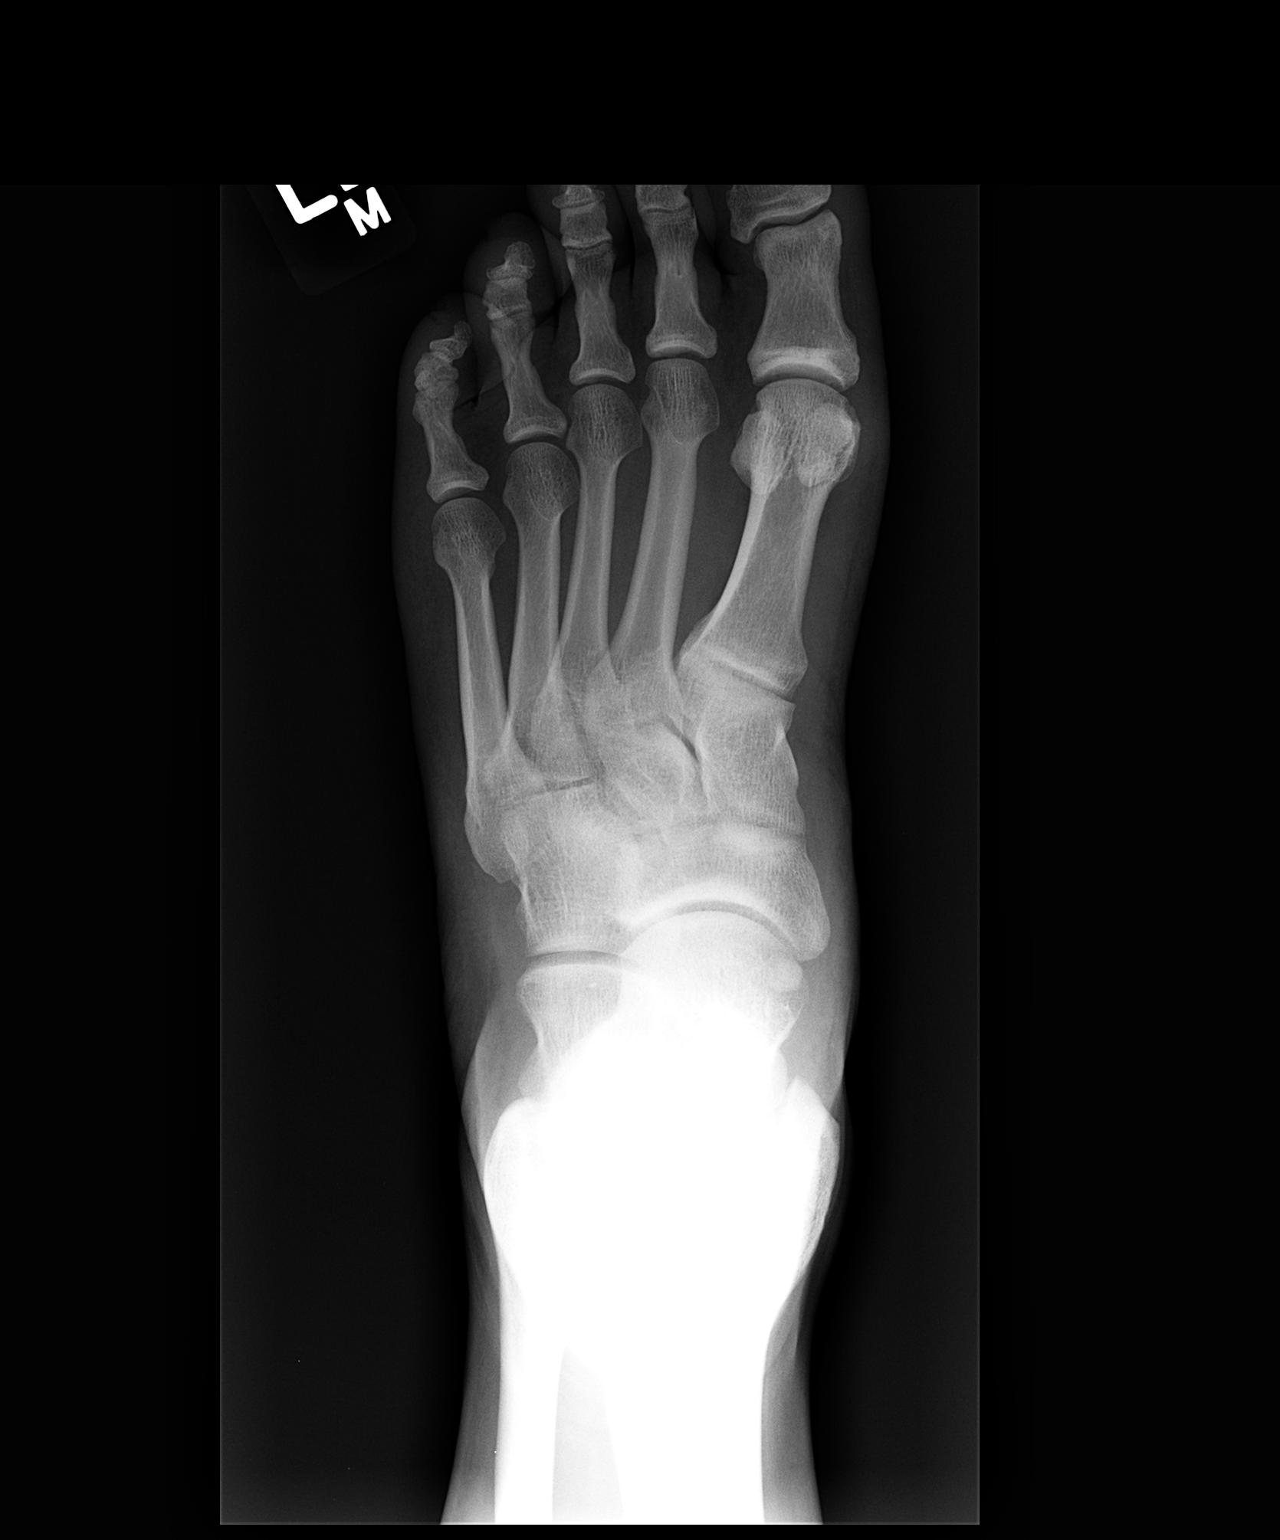

[view not recorded (2 of 3)]
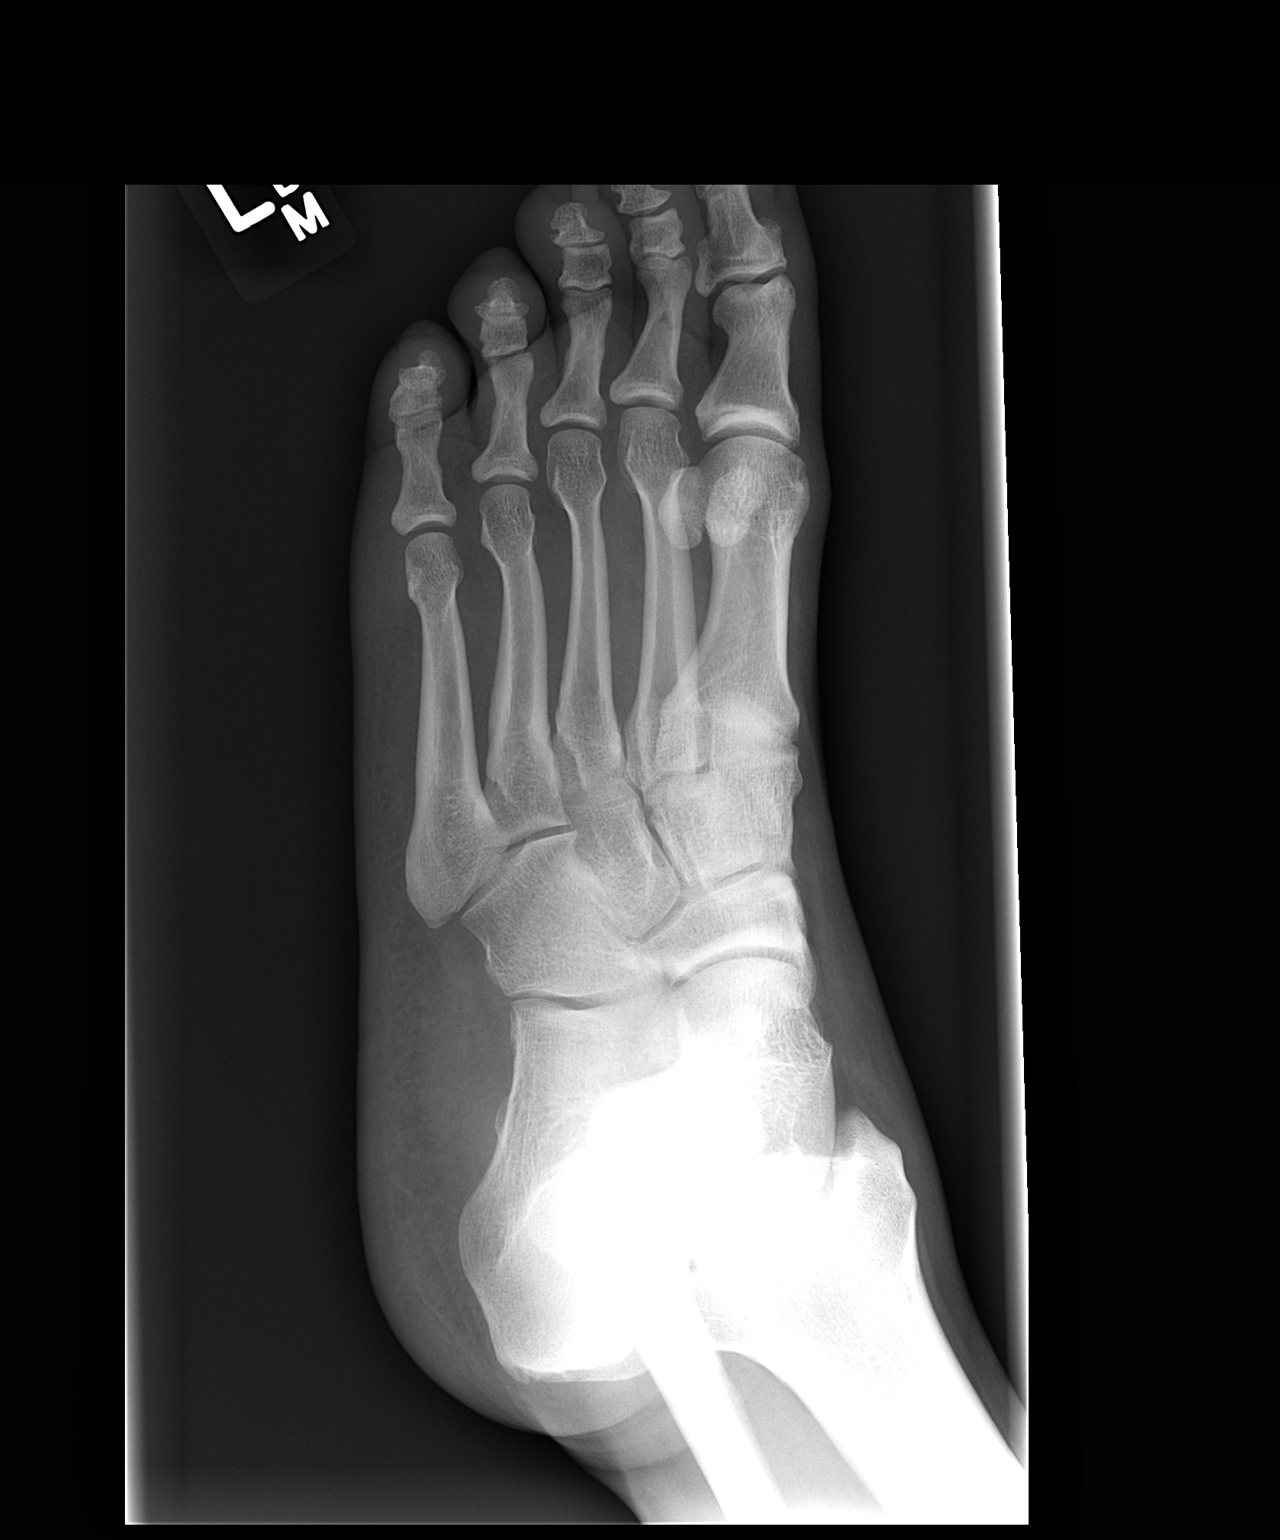

[view not recorded (3 of 3)]
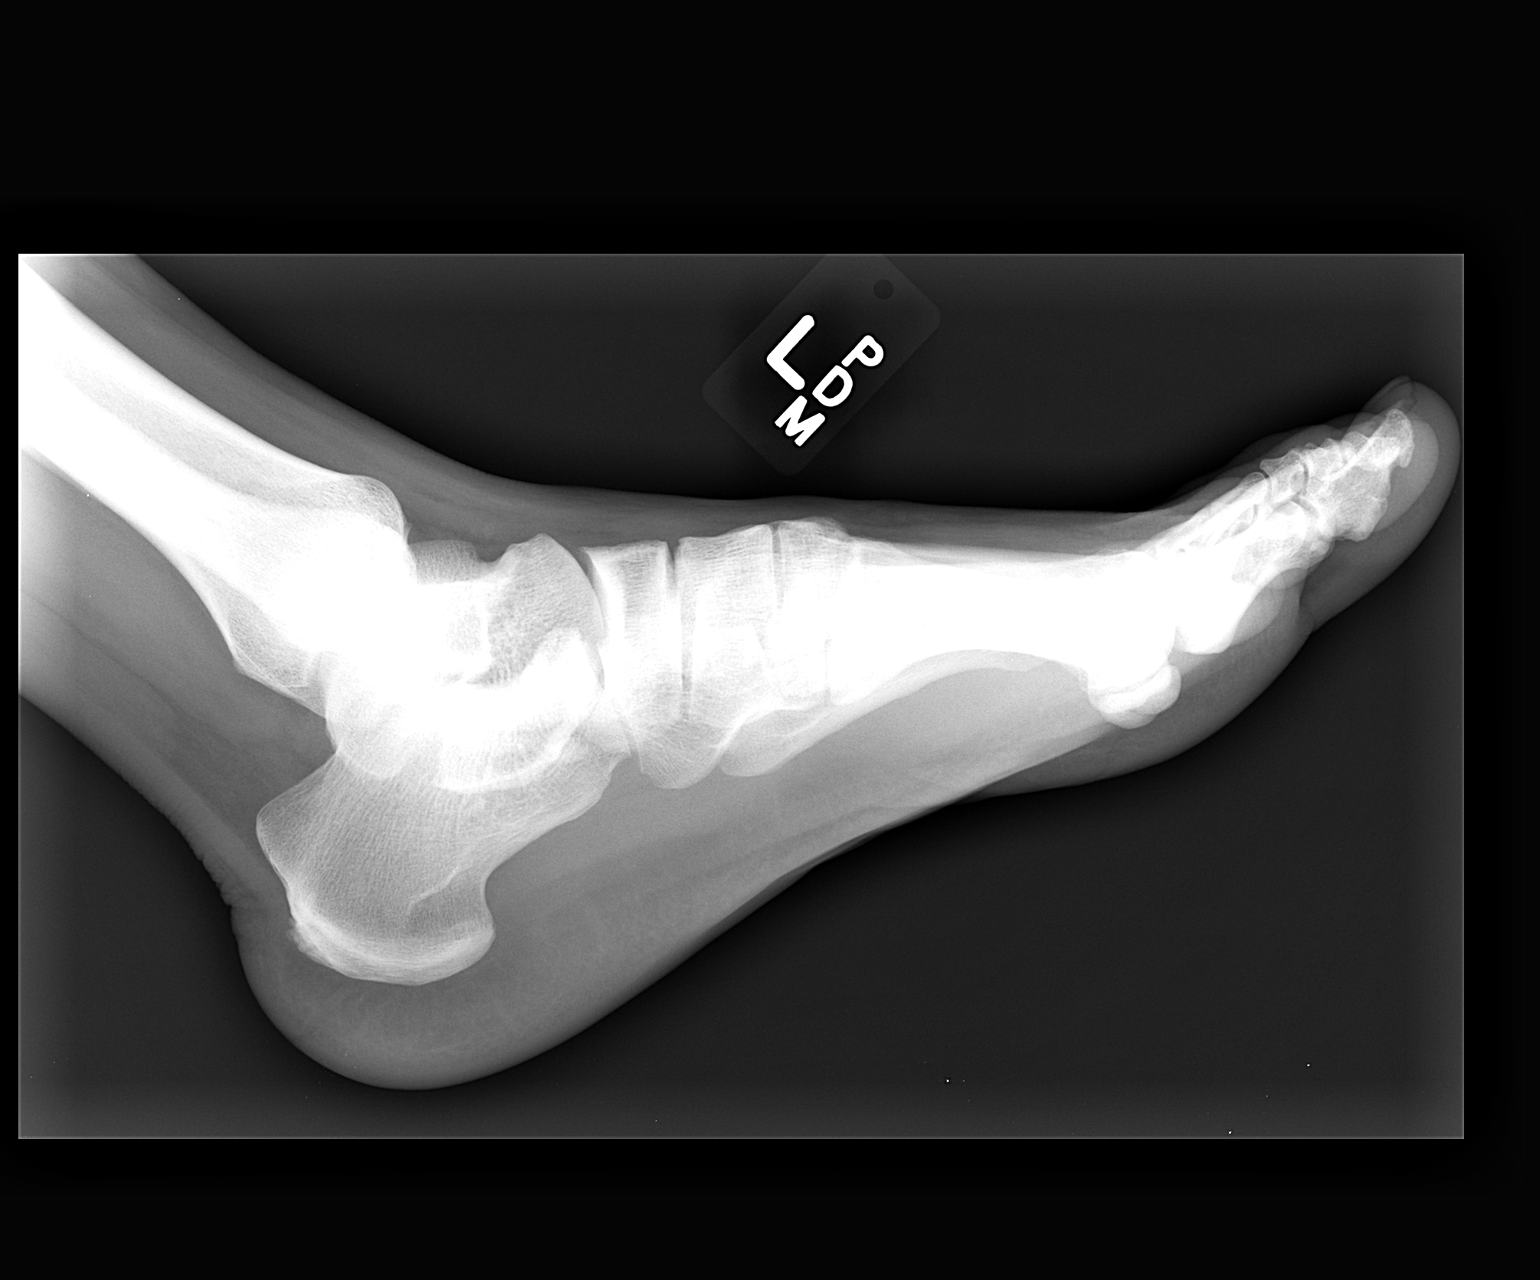

[3 of 3 positions shown; findings below may reference images not displayed]

FINDINGS: Tarsal-metatarsal alignment is normal. No fracture is seen. Joint
spaces appear normal. No erosion is noted.
IMPRESSION: Negative.

## 2015-12-26 DIAGNOSIS — M25531 Pain in right wrist: Secondary | ICD-10-CM | POA: Diagnosis not present

## 2015-12-28 ENCOUNTER — Other Ambulatory Visit (INDEPENDENT_AMBULATORY_CARE_PROVIDER_SITE_OTHER): Payer: Medicare HMO

## 2015-12-28 ENCOUNTER — Ambulatory Visit (INDEPENDENT_AMBULATORY_CARE_PROVIDER_SITE_OTHER): Payer: Medicare HMO | Admitting: Family

## 2015-12-28 ENCOUNTER — Encounter: Payer: Self-pay | Admitting: Family

## 2015-12-28 VITALS — BP 138/82 | HR 69 | Temp 98.4°F | Resp 16 | Ht 66.0 in | Wt 187.0 lb

## 2015-12-28 DIAGNOSIS — M25531 Pain in right wrist: Secondary | ICD-10-CM | POA: Diagnosis not present

## 2015-12-28 DIAGNOSIS — R0789 Other chest pain: Secondary | ICD-10-CM

## 2015-12-28 DIAGNOSIS — E785 Hyperlipidemia, unspecified: Secondary | ICD-10-CM

## 2015-12-28 LAB — LIPID PANEL
CHOLESTEROL: 244 mg/dL — AB (ref 0–200)
HDL: 29.9 mg/dL — AB (ref >39.00)
LDL Cholesterol: 187 mg/dL — ABNORMAL HIGH (ref 0–99)
NonHDL: 214.55
Total CHOL/HDL Ratio: 8
Triglycerides: 136 mg/dL (ref 0.0–149.0)
VLDL: 27.2 mg/dL (ref 0.0–40.0)

## 2015-12-28 NOTE — Patient Instructions (Signed)
Thank you for choosing [facility].  Summary/Instructions:   Please stop by the lab on the lower level of the building for your blood work. Your results will be released to MyChart (or called to you) after review, usually within 72 hours after test completion. If any changes need to be made, you will be notified at that same time.  1. The lab is open from 7:30am to 5:30 pm Monday-Friday  2. No appointment is necessary  3. Fasting (if needed) is 6-8 hours after food and drink; black  coffee and water  are okay   If your symptoms worsen or fail to improve, please contact our office for further instruction, or in case of emergency go directly to the emergency room at the closest medical facility.     

## 2015-12-28 NOTE — Assessment & Plan Note (Addendum)
In office EKG independently interpreted to show normal sinus rhythm. Chest pressure unlikely related to cardiac origin and possible muscle skeletal. Recommend ice/heat and home exercise therapy. If symptoms worsen or do not improve consider additional workup.

## 2015-12-28 NOTE — Assessment & Plan Note (Signed)
Hyperlipidemia not currently managed on medication which patient believes is a result of his previous medication regimen of olanzapine for his schizophrenia. Obtain lipid profile to determine current status. Consider restarting simvastatin if lipid profile remains elevated.

## 2015-12-28 NOTE — Progress Notes (Signed)
Subjective:    Patient ID: Justin HutchingShawn Jiminez, male    DOB: 11/16/1975, 40 y.o.   MRN: [id number]  Chief Complaint  Patient presents with  . Follow-up    wants to talk about cholesterol, chest pressure x1 week    HPI:  Justin Sanchez is a 40 y.o. male who  has a past medical history of Hyperlipidemia; Chicken pox; Schizophrenia (HCC); Anemia; Depression; Alcohol abuse; and Drug abuse. and presents today for a follow up office visit.   1.) Hyperlipidemia - Previously diagnosed with hyperlipidemia. Started on Zocor and has since stopped taking the medication and would like to have his cholesterol rechecked to determine continued need of medication. No adverse side effects when he was taking the medication.  2.) Chest pressure - This is a new problem. Associated symptom of pressure located in his chest has been going on for about 1 week. Denies any pain in his arm or jaw. Not exertional related. Described as pressure and noted when sitting and lying down. Denies any heartburn symptoms. No night time awakenings and is able to lie flat without problem. No trauma. Indicates there may have been a day or so that he slept wrong. Notes that this has occurred in the past but appears to be seasonal related.   No Known Allergies   Current Outpatient Prescriptions on File Prior to Visit  Medication Sig Dispense Refill  . ibuprofen (ADVIL,MOTRIN) 800 MG tablet Take 1 tablet (800 mg total) by mouth 3 (three) times daily. 21 tablet 0  . IRON PO Take by mouth.    . traMADol (ULTRAM) 50 MG tablet Take 1 tablet (50 mg total) by mouth every 8 (eight) hours as needed. 30 tablet 0  . valACYclovir (VALTREX) 500 MG tablet Take 1 tablet (500 mg total) by mouth 2 (two) times daily. 10 tablet 2   No current facility-administered medications on file prior to visit.    Past Medical History  Diagnosis Date  . Hyperlipidemia   . Chicken pox   . Schizophrenia (HCC)     2002 - well controlled  . Anemia     Iron  deficinecy - taking iron  . Depression     Long time ago - denies currently for 10 years  . Alcohol abuse     Sober since 2003  . Drug abuse      Review of Systems  Constitutional: Negative for fever and chills.  HENT: Negative for congestion.   Respiratory: Positive for chest tightness. Negative for shortness of breath.   Cardiovascular: Negative for chest pain, palpitations and leg swelling.  Neurological: Negative for headaches.      Objective:    BP 138/82 mmHg  Pulse 69  Temp(Src) 98.4 F (36.9 C) (Oral)  Resp 16  Ht 5\' 6"  (1.676 m)  Wt 187 lb (84.823 kg)  BMI 30.20 kg/m2  SpO2 96% Nursing note and vital signs reviewed.  Physical Exam  Constitutional: He is oriented to person, place, and time. He appears well-developed and well-nourished. No distress.  Cardiovascular: Normal rate, regular rhythm, normal heart sounds and intact distal pulses.   Pulmonary/Chest: Effort normal and breath sounds normal.  Neurological: He is alert and oriented to person, place, and time.  Skin: Skin is warm and dry.  Psychiatric: He has a normal mood and affect. His behavior is normal. Judgment and thought content normal.       Assessment & Plan:   Problem List Items Addressed This Visit  Other   Hyperlipidemia    Hyperlipidemia not currently managed on medication which patient believes is a result of his previous medication regimen of olanzapine for his schizophrenia. Obtain lipid profile to determine current status. Consider restarting simvastatin if lipid profile remains elevated.      Relevant Orders   Lipid Profile   Chest pressure - Primary    In office EKG independently interpreted to show normal sinus rhythm. Chest pressure unlikely related to cardiac origin and possible muscle skeletal. Recommend ice/heat and home exercise therapy. If symptoms worsen or do not improve consider additional workup.      Relevant Orders   EKG 12-Lead (Completed)      I have  discontinued Mr. Mele Vitamin D (Ergocalciferol), methocarbamol, simvastatin, and OLANZapine. I am also having him maintain his IRON PO, valACYclovir, ibuprofen, and traMADol.   Follow-up: Return in about 1 month (around 01/28/2016), or if symptoms worsen or fail to improve.  Jeanine Luz, FNP

## 2016-01-01 ENCOUNTER — Other Ambulatory Visit: Payer: Self-pay | Admitting: *Deleted

## 2016-01-01 MED ORDER — SIMVASTATIN 20 MG PO TABS: 20.0000 mg | ORAL_TABLET | Freq: Every day | ORAL | Status: DC

## 2016-01-01 NOTE — Telephone Encounter (Signed)
Called pt w/labs results. Justin SoursGreg starting him back on his cholesterol medication. Pt stAtes he will need rx sent to [facility]. Sent to [facility]...Raechel Chute/lmb

## 2016-01-02 DIAGNOSIS — M25531 Pain in right wrist: Secondary | ICD-10-CM | POA: Diagnosis not present

## 2016-01-04 DIAGNOSIS — M25531 Pain in right wrist: Secondary | ICD-10-CM | POA: Diagnosis not present

## 2016-01-09 ENCOUNTER — Other Ambulatory Visit: Payer: Self-pay | Admitting: Family

## 2016-02-04 ENCOUNTER — Other Ambulatory Visit: Payer: Self-pay | Admitting: Family

## 2016-03-05 ENCOUNTER — Other Ambulatory Visit: Payer: Self-pay | Admitting: Family

## 2016-05-13 ENCOUNTER — Encounter: Payer: Self-pay | Admitting: Family

## 2016-05-14 ENCOUNTER — Encounter: Payer: Self-pay | Admitting: Family

## 2016-05-15 ENCOUNTER — Encounter: Payer: Self-pay | Admitting: Family

## 2016-05-29 ENCOUNTER — Encounter: Payer: Self-pay | Admitting: Family

## 2016-06-03 ENCOUNTER — Ambulatory Visit (INDEPENDENT_AMBULATORY_CARE_PROVIDER_SITE_OTHER): Payer: Medicare HMO | Admitting: Family

## 2016-06-03 ENCOUNTER — Other Ambulatory Visit (INDEPENDENT_AMBULATORY_CARE_PROVIDER_SITE_OTHER): Payer: Medicare HMO

## 2016-06-03 ENCOUNTER — Encounter: Payer: Self-pay | Admitting: Family

## 2016-06-03 VITALS — BP 118/80 | HR 61 | Temp 98.5°F | Resp 16 | Ht 66.0 in | Wt 190.0 lb

## 2016-06-03 DIAGNOSIS — Z711 Person with feared health complaint in whom no diagnosis is made: Secondary | ICD-10-CM

## 2016-06-03 DIAGNOSIS — E782 Mixed hyperlipidemia: Secondary | ICD-10-CM

## 2016-06-03 DIAGNOSIS — D649 Anemia, unspecified: Secondary | ICD-10-CM

## 2016-06-03 LAB — CBC
HEMATOCRIT: 40 % (ref 39.0–52.0)
Hemoglobin: 13.1 g/dL (ref 13.0–17.0)
MCHC: 32.8 g/dL (ref 30.0–36.0)
MCV: 81.1 fl (ref 78.0–100.0)
PLATELETS: 209 10*3/uL (ref 150.0–400.0)
RBC: 4.94 Mil/uL (ref 4.22–5.81)
RDW: 14.1 % (ref 11.5–15.5)
WBC: 7.2 10*3/uL (ref 4.0–10.5)

## 2016-06-03 LAB — LIPID PANEL
CHOL/HDL RATIO: 6
CHOLESTEROL: 187 mg/dL (ref 0–200)
HDL: 31.4 mg/dL — AB (ref >39.00)
LDL Cholesterol: 124 mg/dL — ABNORMAL HIGH (ref 0–99)
NonHDL: 155.74
TRIGLYCERIDES: 160 mg/dL — AB (ref 0.0–149.0)
VLDL: 32 mg/dL (ref 0.0–40.0)

## 2016-06-03 LAB — IBC PANEL
Iron: 98 ug/dL (ref 42–165)
Saturation Ratios: 30.6 % (ref 20.0–50.0)
TRANSFERRIN: 229 mg/dL (ref 212.0–360.0)

## 2016-06-03 NOTE — Assessment & Plan Note (Addendum)
Previously diagnosed with iron deficiency anemia and maintained on a multivitamin containing iron. Obtain IBC panel and CBC to check current status for therapeutic drug monitoring. Continue current iron containing multivitamin pending blood work results.

## 2016-06-03 NOTE — Patient Instructions (Signed)
Thank you for choosing [facility].  SUMMARY AND INSTRUCTIONS:  Medication:  Please continue to take your medication as prescribed.   Continue with the multivitamin.   Your prescription(s) have been submitted to your pharmacy or been printed and provided for you. Please take as directed and contact our office if you believe you are having problem(s) with the medication(s) or have any questions.  Follow up:  If your symptoms worsen or fail to improve, please contact our office for further instruction, or in case of emergency go directly to the emergency room at the closest medical facility.

## 2016-06-03 NOTE — Progress Notes (Signed)
Subjective:    Patient ID: Justin HutchingShawn Sawka, male    DOB: 05/14/1976, 40 y.o.   MRN: [id number]  Chief Complaint  Patient presents with  . Lab work    wants cholesterol, std, and iron check    HPI:  Justin Sanchez is a 40 y.o. male who  has a past medical history of Alcohol abuse; Anemia; Chicken pox; Depression; Drug abuse; Hyperlipidemia; and Schizophrenia (HCC). and presents today for an office visit.   1.) Hyperlipidemia - Previously diagnosed with hyperlipidemia and maintained on medications with potential side effects for increasing cholesterol. Not currently taking cholesterol medications. Denies chest pain, shortness of breath, or heart palpitations.   Lab Results  Component Value Date   CHOL 187 06/03/2016   HDL 31.40 (L) 06/03/2016   LDLCALC 124 (H) 06/03/2016   TRIG 160.0 (H) 06/03/2016   CHOLHDL 6 06/03/2016    2.) Concern for STD - Has concerns for the possibility of STD as he has been sexually active recently and is with a new partner. Denies any potential exposures that he is aware of. Denies any urinary symptoms, penile discharge, or rashes. No fevers, chills or night sweats.    3.) Anemia - Previously diagnosed with anemia and is currently taking iron through a multivitamin. Denies any fatigue or shortness of breath.    Lab Results  Component Value Date   WBC 7.2 06/03/2016   HGB 13.1 06/03/2016   HCT 40.0 06/03/2016   MCV 81.1 06/03/2016   PLT 209.0 06/03/2016     No Known Allergies    Outpatient Medications Prior to Visit  Medication Sig Dispense Refill  . IRON PO Take by mouth.    . OLANZapine (ZYPREXA) 5 MG tablet TAKE 1 TABLET BY MOUTH DAILY 30 tablet 0  . simvastatin (ZOCOR) 20 MG tablet Take 1 tablet (20 mg total) by mouth daily at 6 PM. 90 tablet 3  . valACYclovir (VALTREX) 500 MG tablet Take 1 tablet (500 mg total) by mouth 2 (two) times daily. 10 tablet 2  . ibuprofen (ADVIL,MOTRIN) 800 MG tablet Take 1 tablet (800 mg total) by mouth 3  (three) times daily. 21 tablet 0  . OLANZapine (ZYPREXA) 5 MG tablet TAKE 1 TABLET BY MOUTH DAILY 90 tablet 1  . traMADol (ULTRAM) 50 MG tablet Take 1 tablet (50 mg total) by mouth every 8 (eight) hours as needed. 30 tablet 0   No facility-administered medications prior to visit.       No past surgical history on file.    Past Medical History:  Diagnosis Date  . Alcohol abuse    Sober since 2003  . Anemia    Iron deficinecy - taking iron  . Chicken pox   . Depression    Long time ago - denies currently for 10 years  . Drug abuse   . Hyperlipidemia   . Schizophrenia (HCC)    2002 - well controlled      Review of Systems  Constitutional: Negative for chills, fatigue and fever.  Respiratory: Negative for chest tightness, shortness of breath and wheezing.   Cardiovascular: Negative for chest pain, palpitations and leg swelling.  Genitourinary: Negative for decreased urine volume, frequency, genital sores, hematuria, penile pain, penile swelling, scrotal swelling, testicular pain and urgency.      Objective:    BP 118/80 (BP Location: Left Arm, Patient Position: Sitting, Cuff Size: Large)   Pulse 61   Temp 98.5 F (36.9 C) (Oral)   Resp 16  Ht 5\' 6"  (1.676 m)   Wt 190 lb (86.2 kg)   SpO2 97%   BMI 30.67 kg/m  Nursing note and vital signs reviewed.  Physical Exam  Constitutional: He is oriented to person, place, and time. He appears well-developed and well-nourished. No distress.  Cardiovascular: Normal rate, regular rhythm, normal heart sounds and intact distal pulses.   Pulmonary/Chest: Effort normal and breath sounds normal.  Neurological: He is alert and oriented to person, place, and time.  Skin: Skin is warm and dry.  Psychiatric: He has a normal mood and affect. His behavior is normal. Judgment and thought content normal.       Assessment & Plan:   Problem List Items Addressed This Visit      Other   Hyperlipidemia - Primary    Currently maintained  on simvastatin with no adverse side effects. Obtain lipid profile. Continue current dosage of simvastatin pending lipid profile results.      Relevant Orders   Lipid panel (Completed)   Anemia    Previously diagnosed with iron deficiency anemia and maintained on ferrous sulfate. Obtain IBC panel and CBC to check current status for therapeutic drug monitoring. Continue current dosage of ferrous sulfate pending blood work results.      Relevant Orders   CBC (Completed)   IBC panel (Completed)   Concern about STD in male without diagnosis    Concern for exposure to sexual transmitted diseases as he is newly sexually active. No current symptoms. Obtain GC/chlamydia probe, HIV, and RPR. Encouraged safe sexual practices to decreased risk for sexual transmitted disease in the future. Continue to monitor pending lab work results.      Relevant Orders   GC/chlamydia probe amp, urine   HIV antibody   RPR       I have discontinued Mr. Norris CrossWilliams's ibuprofen and traMADol. I am also having him maintain his IRON PO, valACYclovir, simvastatin, and OLANZapine.   Follow-up: Return if symptoms worsen or fail to improve.  Jeanine Luzalone, Gregory, FNP

## 2016-06-03 NOTE — Assessment & Plan Note (Signed)
Concern for exposure to sexual transmitted diseases as he is newly sexually active. No current symptoms. Obtain GC/chlamydia probe, HIV, and RPR. Encouraged safe sexual practices to decreased risk for sexual transmitted disease in the future. Continue to monitor pending lab work results.

## 2016-06-03 NOTE — Assessment & Plan Note (Addendum)
Currently maintained on simvastatin with no adverse side effects. Obtain lipid profile. Continue current dosage of simvastatin pending lipid profile results. 

## 2016-06-04 ENCOUNTER — Encounter: Payer: Self-pay | Admitting: Family

## 2016-06-04 LAB — HIV ANTIBODY (ROUTINE TESTING W REFLEX): HIV: NONREACTIVE

## 2016-06-04 LAB — RPR

## 2016-06-04 LAB — GC/CHLAMYDIA PROBE AMP
CT Probe RNA: NOT DETECTED
GC Probe RNA: NOT DETECTED

## 2016-06-30 ENCOUNTER — Encounter: Payer: Self-pay | Admitting: Family

## 2016-06-30 ENCOUNTER — Ambulatory Visit (INDEPENDENT_AMBULATORY_CARE_PROVIDER_SITE_OTHER): Payer: Medicare HMO | Admitting: Family

## 2016-06-30 VITALS — BP 110/78 | HR 64 | Temp 98.3°F | Resp 16 | Ht 66.0 in | Wt 185.0 lb

## 2016-06-30 DIAGNOSIS — J111 Influenza due to unidentified influenza virus with other respiratory manifestations: Secondary | ICD-10-CM | POA: Diagnosis not present

## 2016-06-30 LAB — POCT INFLUENZA A/B
Influenza A, POC: POSITIVE — AB
Influenza B, POC: POSITIVE — AB

## 2016-06-30 NOTE — Progress Notes (Signed)
Subjective:    Patient ID: Justin Sanchez, male    DOB: 10/16/1975, 41 y.o.   MRN: [id number]  Chief Complaint  Patient presents with  . Cough    x4 days, sweats, cough, congestion, fatigue, and chills    HPI:  Justin Sanchez is a 41 y.o. male who  has a past medical history of Alcohol abuse; Anemia; Chicken pox; Depression; Drug abuse; Hyperlipidemia; and Schizophrenia (HCC). and presents today for an acute office visit.   This is a new problem. Associated symptoms of sweats, cough, congestion, fatigue and chills have been going on for about 4 days. Denies any fevers that he is aware of. Modifying factors include Theraflu and Robutussin which have helped to manage his symptoms. Over the course of the 4 days he feels like he is getting better.    No Known Allergies    Outpatient Medications Prior to Visit  Medication Sig Dispense Refill  . IRON PO Take by mouth.    . OLANZapine (ZYPREXA) 5 MG tablet TAKE 1 TABLET BY MOUTH DAILY 30 tablet 0  . simvastatin (ZOCOR) 20 MG tablet Take 1 tablet (20 mg total) by mouth daily at 6 PM. 90 tablet 3  . valACYclovir (VALTREX) 500 MG tablet Take 1 tablet (500 mg total) by mouth 2 (two) times daily. 10 tablet 2   No facility-administered medications prior to visit.      Review of Systems  Constitutional: Positive for chills and fatigue. Negative for fever.  HENT: Positive for congestion. Negative for sore throat.   Respiratory: Positive for cough. Negative for chest tightness, shortness of breath and wheezing.   Cardiovascular: Negative for chest pain.  Neurological: Negative for headaches.      Objective:    BP 110/78 (BP Location: Left Arm, Patient Position: Sitting, Cuff Size: Normal)   Pulse 64   Temp 98.3 F (36.8 C) (Oral)   Resp 16   Ht 5\' 6"  (1.676 m)   Wt 185 lb (83.9 kg)   SpO2 94%   BMI 29.86 kg/m  Nursing note and vital signs reviewed.  Physical Exam  Constitutional: He is oriented to person, place, and time. He  appears well-developed and well-nourished.  Non-toxic appearance. He does not have a sickly appearance. He does not appear ill. No distress.  HENT:  Right Ear: Hearing, tympanic membrane, external ear and ear canal normal.  Left Ear: Hearing, tympanic membrane, external ear and ear canal normal.  Nose: Nose normal.  Mouth/Throat: Uvula is midline, oropharynx is clear and moist and mucous membranes are normal.  Cardiovascular: Normal rate, regular rhythm, normal heart sounds and intact distal pulses.   Pulmonary/Chest: Effort normal and breath sounds normal. No respiratory distress. He has no wheezes. He has no rales. He exhibits no tenderness.  Neurological: He is alert and oriented to person, place, and time.  Skin: Skin is warm and dry.  Psychiatric: He has a normal mood and affect. His behavior is normal. Judgment and thought content normal.       Assessment & Plan:   Problem List Items Addressed This Visit      Respiratory   Influenza - Primary    In office flu test positive. Treat conservatively with over-the-counter medications as needed for symptom relief and supportive care. Beyond the window for Tamiflu. Continue to monitor and follow-up if symptoms worsen or do not improve.      Relevant Orders   POCT Influenza A/B (Completed)       I am  having [name] maintain his IRON PO, valACYclovir, simvastatin, and OLANZapine.   Follow-up: Return if symptoms worsen or fail to improve.  Jeanine Luz, FNP

## 2016-06-30 NOTE — Assessment & Plan Note (Signed)
In office flu test positive. Treat conservatively with over-the-counter medications as needed for symptom relief and supportive care. Beyond the window for Tamiflu. Continue to monitor and follow-up if symptoms worsen or do not improve.

## 2016-06-30 NOTE — Patient Instructions (Signed)
Thank you for choosing [facility].  SUMMARY AND INSTRUCTIONS:    Follow up:  If your symptoms worsen or fail to improve, please contact our office for further instruction, or in case of emergency go directly to the emergency room at the closest medical facility.    General Recommendations:    Please drink plenty of fluids.  Get plenty of rest   Sleep in humidified air  Use saline nasal sprays  Netti pot   OTC Medications:  Decongestants - helps relieve congestion   Flonase (generic fluticasone) or Nasacort (generic triamcinolone) - please make sure to use the "cross-over" technique at a 45 degree angle towards the opposite eye as opposed to straight up the nasal passageway.   Sudafed (generic pseudoephedrine - Note this is the one that is available behind the pharmacy counter); Products with phenylephrine (-PE) may also be used but is often not as effective as pseudoephedrine.   If you have HIGH BLOOD PRESSURE - Coricidin HBP; AVOID any product that is -D as this contains pseudoephedrine which may increase your blood pressure.  Afrin (oxymetazoline) every 6-8 hours for up to 3 days.   Allergies - helps relieve runny nose, itchy eyes and sneezing   Claritin (generic loratidine), Allegra (fexofenidine), or Zyrtec (generic cyrterizine) for runny nose. These medications should not cause drowsiness.  Note - Benadryl (generic diphenhydramine) may be used however may cause drowsiness  Cough -   Delsym or Robitussin (generic dextromethorphan)  Expectorants - helps loosen mucus to ease removal   Mucinex (generic guaifenesin) as directed on the package.  Headaches / General Aches   Tylenol (generic acetaminophen) - DO NOT EXCEED 3 grams (3,000 mg) in a 24 hour time period  Advil/Motrin (generic ibuprofen)   Sore Throat -   Salt water gargle   Chloraseptic (generic benzocaine) spray or lozenges / Sucrets (generic dyclonine)      Influenza,  Adult Influenza ("the flu") is an infection in the lungs, nose, and throat (respiratory tract). It is caused by a virus. The flu causes many common cold symptoms, as well as a high fever and body aches. It can make you feel very sick. The flu spreads easily from person to person (is contagious). Getting a flu shot (influenza vaccination) every year is the best way to prevent the flu. Follow these instructions at home:  Take over-the-counter and prescription medicines only as told by your doctor.  Use a cool mist humidifier to add moisture (humidity) to the air in your home. This can make it easier to breathe.  Rest as needed.  Drink enough fluid to keep your pee (urine) clear or pale yellow.  Cover your mouth and nose when you cough or sneeze.  Wash your hands with soap and water often, especially after you cough or sneeze. If you cannot use soap and water, use hand sanitizer.  Stay home from work or school as told by your doctor. Unless you are visiting your doctor, try to avoid leaving home until your fever has been gone for 24 hours without the use of medicine.  Keep all follow-up visits as told by your doctor. This is important. How is this prevented?  Getting a yearly (annual) flu shot is the best way to avoid getting the flu. You may get the flu shot in late summer, fall, or winter. Ask your doctor when you should get your flu shot.  Wash your hands often or use hand sanitizer often.  Avoid contact with people who are sick  during cold and flu season.  Eat healthy foods.  Drink plenty of fluids.  Get enough sleep.  Exercise regularly. Contact a doctor if:  You get new symptoms.  You have:  Chest pain.  Watery poop (diarrhea).  A fever.  Your cough gets worse.  You start to have more mucus.  You feel sick to your stomach (nauseous).  You throw up (vomit). Get help right away if:  You start to be short of breath or have trouble breathing.  Your skin or  nails turn a bluish color.  You have very bad pain or stiffness in your neck.  You get a sudden headache.  You get sudden pain in your face or ear.  You cannot stop throwing up. This information is not intended to replace advice given to you by your health care provider. Make sure you discuss any questions you have with your health care provider. Document Released: 03/11/2008 Document Revised: 11/08/2015 Document Reviewed: 03/27/2015 Elsevier Interactive Patient Education  2017 [employer].

## 2016-07-17 DIAGNOSIS — R21 Rash and other nonspecific skin eruption: Secondary | ICD-10-CM | POA: Diagnosis not present

## 2016-07-17 DIAGNOSIS — L309 Dermatitis, unspecified: Secondary | ICD-10-CM | POA: Diagnosis not present

## 2016-07-17 DIAGNOSIS — L821 Other seborrheic keratosis: Secondary | ICD-10-CM | POA: Diagnosis not present

## 2016-07-24 DIAGNOSIS — Z4802 Encounter for removal of sutures: Secondary | ICD-10-CM | POA: Diagnosis not present

## 2016-07-24 DIAGNOSIS — L2089 Other atopic dermatitis: Secondary | ICD-10-CM | POA: Diagnosis not present

## 2016-07-29 DIAGNOSIS — L2089 Other atopic dermatitis: Secondary | ICD-10-CM | POA: Diagnosis not present

## 2016-08-01 DIAGNOSIS — L2089 Other atopic dermatitis: Secondary | ICD-10-CM | POA: Diagnosis not present

## 2016-08-05 DIAGNOSIS — L2089 Other atopic dermatitis: Secondary | ICD-10-CM | POA: Diagnosis not present

## 2016-08-12 DIAGNOSIS — L2089 Other atopic dermatitis: Secondary | ICD-10-CM | POA: Diagnosis not present

## 2016-08-19 DIAGNOSIS — L2089 Other atopic dermatitis: Secondary | ICD-10-CM | POA: Diagnosis not present

## 2016-08-22 DIAGNOSIS — L2089 Other atopic dermatitis: Secondary | ICD-10-CM | POA: Diagnosis not present

## 2016-08-27 ENCOUNTER — Other Ambulatory Visit: Payer: Self-pay | Admitting: Family

## 2016-08-29 DIAGNOSIS — L2089 Other atopic dermatitis: Secondary | ICD-10-CM | POA: Diagnosis not present

## 2016-09-02 DIAGNOSIS — L2089 Other atopic dermatitis: Secondary | ICD-10-CM | POA: Diagnosis not present

## 2016-09-05 DIAGNOSIS — L2089 Other atopic dermatitis: Secondary | ICD-10-CM | POA: Diagnosis not present

## 2016-09-10 DIAGNOSIS — L2089 Other atopic dermatitis: Secondary | ICD-10-CM | POA: Diagnosis not present

## 2016-09-16 DIAGNOSIS — L2081 Atopic neurodermatitis: Secondary | ICD-10-CM | POA: Diagnosis not present

## 2016-09-16 DIAGNOSIS — L2089 Other atopic dermatitis: Secondary | ICD-10-CM | POA: Diagnosis not present

## 2016-09-19 DIAGNOSIS — L2089 Other atopic dermatitis: Secondary | ICD-10-CM | POA: Diagnosis not present

## 2016-09-25 ENCOUNTER — Telehealth: Payer: Self-pay | Admitting: Family

## 2016-09-25 ENCOUNTER — Ambulatory Visit (INDEPENDENT_AMBULATORY_CARE_PROVIDER_SITE_OTHER): Payer: Medicare HMO | Admitting: Family Medicine

## 2016-09-25 ENCOUNTER — Encounter: Payer: Self-pay | Admitting: Family Medicine

## 2016-09-25 ENCOUNTER — Encounter: Payer: Self-pay | Admitting: Family

## 2016-09-25 VITALS — BP 118/80 | HR 71 | Temp 98.4°F | Ht 66.0 in | Wt 182.1 lb

## 2016-09-25 DIAGNOSIS — J310 Chronic rhinitis: Secondary | ICD-10-CM

## 2016-09-25 DIAGNOSIS — J029 Acute pharyngitis, unspecified: Secondary | ICD-10-CM

## 2016-09-25 DIAGNOSIS — L2089 Other atopic dermatitis: Secondary | ICD-10-CM | POA: Diagnosis not present

## 2016-09-25 NOTE — Telephone Encounter (Signed)
Is this ok?

## 2016-09-25 NOTE — Telephone Encounter (Signed)
Ok with me 

## 2016-09-25 NOTE — Progress Notes (Signed)
HPI:  Acute visit for URI: -started: 3 days agp -symptoms:clearnasal congestion, sore throat, sneezing, occasional cough, PND -denies:fever, SOB, NVD, tooth pain, body aches, rash -has tried:  nothing -sick contacts/travel/risks: daughter dx with strep recently -Hx of: seasonal allergies  ROS: See pertinent positives and negatives per HPI.  Past Medical History:  Diagnosis Date  . Alcohol abuse    Sober since 2003  . Anemia    Iron deficinecy - taking iron  . Chicken pox   . Depression    Long time ago - denies currently for 10 years  . Drug abuse   . Hyperlipidemia   . Schizophrenia (HCC)    2002 - well controlled    No past surgical history on file.  Family History  Problem Relation Age of Onset  . Prostate cancer Maternal Uncle     Social History   Social History  . Marital status: Divorced    Spouse name: [name]  . Number of children: 1  . Years of education: 64   Occupational History  . Other     Works [employer]   Social History Main Topics  . Smoking status: Former Smoker    Packs/day: 1.50    Years: 6.00  . Smokeless tobacco: Never Used  . Alcohol use No  . Drug use: No  . Sexual activity: Yes    Birth control/ protection: Condom   Other Topics Concern  . None   Social History Narrative   Born in [place], [place]   Raised [place], [place]   Completed some college   Family brought to [facility]      Cycle and exercise, read,    Diet: eats a little bit of everything, stays away from fried food - eats lots of vegetables.      Current Outpatient Prescriptions:  .  IRON PO, Take by mouth., Disp: , Rfl:  .  OLANZapine (ZYPREXA) 5 MG tablet, TAKE 1 TABLET BY MOUTH DAILY, Disp: 30 tablet, Rfl: 0 .  OLANZapine (ZYPREXA) 5 MG tablet, TAKE 1 TABLET BY MOUTH DAILY, Disp: 90 tablet, Rfl: 1 .  simvastatin (ZOCOR) 20 MG tablet, Take 1 tablet (20 mg total) by mouth daily at 6 PM., Disp: 90 tablet, Rfl: 3 .  valACYclovir (VALTREX)  500 MG tablet, Take 1 tablet (500 mg total) by mouth 2 (two) times daily., Disp: 10 tablet, Rfl: 2  EXAM:  Vitals:   09/25/16 1354  BP: 118/80  Pulse: 71  Temp: 98.4 F (36.9 C)    Body mass index is 29.39 kg/m.  GENERAL: vitals reviewed and listed above, alert, oriented, appears well hydrated and in no acute distress  HEENT: atraumatic, conjunttiva clear, no obvious abnormalities on inspection of external nose and ears, normal appearance of ear canals and TMs, clear nasal congestion, mild post oropharyngeal erythema with PND, no tonsillar edema or exudate, no sinus TTP  NECK: no obvious masses on inspection  LUNGS: clear to auscultation bilaterally, no wheezes, rales or rhonchi, good air movement  CV: HRRR, no peripheral edema  MS: moves all extremities without noticeable abnormality  PSYCH: pleasant and cooperative, no obvious depression or anxiety  ASSESSMENT AND PLAN:  Discussed the following assessment and plan:  Sore throat - Plan: Culture, Group A Strep  Rhinitis, unspecified chronicity, unspecified type  -given HPI and exam findings today, a serious infection or illness is unlikely. We discussed potential etiologies, with allergic rhinitis vs VURI being most likely, and advised alelrgy regimen, supportive care  and monitoring for this. Rapid strep neg. Strep culture pending given his exposure and concerns. We discussed treatment side effects, likely course, antibiotic misuse, transmission, and signs of developing a serious illness. -of course, we advised to return or notify a doctor immediately if symptoms worsen or persist or new concerns arise. He is interested in transferring to [place] and requests options. Reports this is much closer for him then current PCP office. He feels most comfortable seeing a male provider. He will check with the front office as he leaves.   Patient Instructions  -zyrtec nightly  -flonase daily for 1 month  -warm salt water gargles  if needed  -aleve per instructions if needed  We have ordered labs or studies at this visit. It can take up to 1-2 weeks for results and processing. IF results require follow up or explanation, we will call you with instructions. Clinically stable results will be released to your [facility]. If you have not heard from [place] or cannot find your results in [facility] in 2 weeks please contact our office at [phone].  If you are not yet signed up for [facility], please consider signing up.  I hope you are feeling better soon! Seek care immediately if worsening, new concerns or you are not improving with treatment.     Kriste Basque R., DO

## 2016-09-25 NOTE — Progress Notes (Signed)
Pre visit review using our clinic review tool, if applicable. No additional management support is needed unless otherwise documented below in the visit note. 

## 2016-09-25 NOTE — Telephone Encounter (Signed)
° ° ° ° °  Pt asked if he could transfer to [facility] said this office is closer for him .Will you except him as a transfer pt

## 2016-09-25 NOTE — Patient Instructions (Signed)
-  zyrtec nightly  -flonase daily for 1 month  -warm salt water gargles if needed  -aleve per instructions if needed  We have ordered labs or studies at this visit. It can take up to 1-2 weeks for results and processing. IF results require follow up or explanation, we will call you with instructions. Clinically stable results will be released to your [facility]. If you have not heard from [place] or cannot find your results in [facility] in 2 weeks please contact our office at [phone].  If you are not yet signed up for [facility], please consider signing up.  I hope you are feeling better soon! Seek care immediately if worsening, new concerns or you are not improving with treatment.

## 2016-09-26 NOTE — Telephone Encounter (Signed)
That is fine with me.

## 2016-09-27 LAB — CULTURE, GROUP A STREP

## 2016-09-30 DIAGNOSIS — L2081 Atopic neurodermatitis: Secondary | ICD-10-CM | POA: Diagnosis not present

## 2016-10-03 DIAGNOSIS — L2081 Atopic neurodermatitis: Secondary | ICD-10-CM | POA: Diagnosis not present

## 2016-10-03 DIAGNOSIS — L2089 Other atopic dermatitis: Secondary | ICD-10-CM | POA: Diagnosis not present

## 2016-10-07 DIAGNOSIS — L2089 Other atopic dermatitis: Secondary | ICD-10-CM | POA: Diagnosis not present

## 2016-10-07 DIAGNOSIS — L2081 Atopic neurodermatitis: Secondary | ICD-10-CM | POA: Diagnosis not present

## 2016-10-10 DIAGNOSIS — L2089 Other atopic dermatitis: Secondary | ICD-10-CM | POA: Diagnosis not present

## 2016-10-10 DIAGNOSIS — L309 Dermatitis, unspecified: Secondary | ICD-10-CM | POA: Diagnosis not present

## 2016-10-14 DIAGNOSIS — L2089 Other atopic dermatitis: Secondary | ICD-10-CM | POA: Diagnosis not present

## 2016-10-21 DIAGNOSIS — L2089 Other atopic dermatitis: Secondary | ICD-10-CM | POA: Diagnosis not present

## 2016-10-24 DIAGNOSIS — L2089 Other atopic dermatitis: Secondary | ICD-10-CM | POA: Diagnosis not present

## 2016-10-31 DIAGNOSIS — L2089 Other atopic dermatitis: Secondary | ICD-10-CM | POA: Diagnosis not present

## 2016-11-07 DIAGNOSIS — L2089 Other atopic dermatitis: Secondary | ICD-10-CM | POA: Diagnosis not present

## 2016-11-11 ENCOUNTER — Encounter: Payer: Self-pay | Admitting: Adult Health

## 2016-11-11 ENCOUNTER — Ambulatory Visit (INDEPENDENT_AMBULATORY_CARE_PROVIDER_SITE_OTHER): Payer: Medicare HMO | Admitting: Adult Health

## 2016-11-11 VITALS — BP 108/64 | Temp 98.6°F | Ht 66.0 in | Wt 183.3 lb

## 2016-11-11 DIAGNOSIS — F200 Paranoid schizophrenia: Secondary | ICD-10-CM | POA: Diagnosis not present

## 2016-11-11 DIAGNOSIS — R69 Illness, unspecified: Secondary | ICD-10-CM | POA: Diagnosis not present

## 2016-11-11 DIAGNOSIS — Z7689 Persons encountering health services in other specified circumstances: Secondary | ICD-10-CM | POA: Diagnosis not present

## 2016-11-11 DIAGNOSIS — L2089 Other atopic dermatitis: Secondary | ICD-10-CM | POA: Diagnosis not present

## 2016-11-11 DIAGNOSIS — E782 Mixed hyperlipidemia: Secondary | ICD-10-CM

## 2016-11-11 NOTE — Progress Notes (Signed)
Patient presents to clinic today to establish care. He is a pleasant 41 year old male who  has a past medical history of Anemia; Chicken pox; Depression; Drug abuse; Hyperlipidemia; and Schizophrenia (HCC).   Acute Concerns: Establish Care   Chronic Issues: Schizophrenia - Diagnosed in 2002. Has been maintained on Zyprex 5 mg. Has not seen psychiatry in " 10-15" years. PCP was prescribing his Zyprexa   Hyperlipidemia - maintained on Simvastatin 20 mg  Lab Results  Component Value Date   CHOL 187 06/03/2016   HDL 31.40 (L) 06/03/2016   LDLCALC 124 (H) 06/03/2016   TRIG 160.0 (H) 06/03/2016   CHOLHDL 6 06/03/2016    Health Maintenance: Dental -- Routine Care Vision -- Routine Care  Immunizations -- UTD  Diet: He tries to eat a heart healthy diet  Exercise:He exercises multiple times per week    Past Medical History:  Diagnosis Date  . Anemia    Iron deficinecy - taking iron  . Chicken pox   . Depression    Long time ago - denies currently for 10 years  . Drug abuse   . Hyperlipidemia   . Schizophrenia (HCC)    2002 - well controlled    No past surgical history on file.  Current Outpatient Prescriptions on File Prior to Visit  Medication Sig Dispense Refill  . OLANZapine (ZYPREXA) 5 MG tablet TAKE 1 TABLET BY MOUTH DAILY 90 tablet 1  . simvastatin (ZOCOR) 20 MG tablet Take 1 tablet (20 mg total) by mouth daily at 6 PM. 90 tablet 3  . valACYclovir (VALTREX) 500 MG tablet Take 1 tablet (500 mg total) by mouth 2 (two) times daily. 10 tablet 2  . IRON PO Take by mouth.     No current facility-administered medications on file prior to visit.     No Known Allergies  Family History  Problem Relation Age of Onset  . Prostate cancer Maternal Uncle     Social History   Social History  . Marital status: Divorced    Spouse name: [name]  . Number of children: 1  . Years of education: 4114   Occupational History  . Other     Works [employer]    Social History Main Topics  . Smoking status: Former Smoker    Packs/day: 1.50    Years: 6.00  . Smokeless tobacco: Never Used  . Alcohol use 6.0 oz/week    10 Cans of beer per week  . Drug use: No  . Sexual activity: Yes    Birth control/ protection: Condom   Other Topics Concern  . Not on file   Social History Narrative   Born [place], [place]   [place]   Completed some college   [place] [facility] [place]      Cycle and exercise, read,    Diet: eats a little bit of everything, stays away from fried food - eats lots of vegetables.     Review of Systems  Constitutional: Negative.   HENT: Negative.   Eyes: Negative.   Respiratory: Negative.   Cardiovascular: Negative.   Gastrointestinal: Negative.   Genitourinary: Negative.   Musculoskeletal: Negative.   Skin: Negative.   Neurological: Negative.   Endo/Heme/Allergies: Negative.   Psychiatric/Behavioral: Negative.   All other systems reviewed and are negative.   BP 108/64 (BP Location: Right Arm, Patient Position: Sitting, Cuff Size: Normal)   Temp 98.6 F (37 C) (Oral)   Ht 5'  6" (1.676 m)   Wt 183 lb 4.8 oz (83.1 kg)   BMI 29.59 kg/m   Physical Exam  Constitutional: He is oriented to person, place, and time and well-developed, well-nourished, and in no distress. No distress.  HENT:  Head: Normocephalic and atraumatic.  Right Ear: External ear normal.  Left Ear: External ear normal.  Nose: Nose normal.  Mouth/Throat: Oropharynx is clear and moist. No oropharyngeal exudate.  Eyes: Conjunctivae and EOM are normal. Pupils are equal, round, and reactive to light. Right eye exhibits no discharge. Left eye exhibits no discharge. No scleral icterus.  Neck: Normal range of motion. Neck supple.  Cardiovascular: Normal rate, regular rhythm, normal heart sounds and intact distal pulses.  Exam reveals no gallop and no friction rub.   No murmur heard. Pulmonary/Chest: Effort normal and breath  sounds normal. No respiratory distress. He has no wheezes. He has no rales. He exhibits no tenderness.  Musculoskeletal: Normal range of motion. He exhibits no edema, tenderness or deformity.  Neurological: He is alert and oriented to person, place, and time. Gait normal. GCS score is 15.  Skin: Skin is warm and dry. No rash noted. He is not diaphoretic. No erythema. No pallor.  Psychiatric: Mood, memory, affect and judgment normal.  Nursing note and vitals reviewed.  Assessment/Plan:  1. Encounter to establish care - Follow up for CPE - Encouraged to continue to exercise and eat a heart healthy diet  2. Mixed hyperlipidemia - Will check lipids at CPE - Consider increase statin at that time  3. Paranoid schizophrenia (HCC) - Controlled - I am comfortable prescribing his Zyprexa   [name], NP

## 2016-11-18 DIAGNOSIS — L2089 Other atopic dermatitis: Secondary | ICD-10-CM | POA: Diagnosis not present

## 2016-11-21 ENCOUNTER — Encounter: Payer: Medicare HMO | Admitting: Adult Health

## 2016-11-24 DIAGNOSIS — L309 Dermatitis, unspecified: Secondary | ICD-10-CM | POA: Diagnosis not present

## 2016-11-28 ENCOUNTER — Encounter: Payer: Self-pay | Admitting: Adult Health

## 2016-11-28 ENCOUNTER — Ambulatory Visit (INDEPENDENT_AMBULATORY_CARE_PROVIDER_SITE_OTHER): Payer: Medicare HMO | Admitting: Adult Health

## 2016-11-28 VITALS — BP 132/74 | Temp 98.1°F | Ht 66.0 in | Wt 181.6 lb

## 2016-11-28 DIAGNOSIS — Z0000 Encounter for general adult medical examination without abnormal findings: Secondary | ICD-10-CM

## 2016-11-28 DIAGNOSIS — F200 Paranoid schizophrenia: Secondary | ICD-10-CM | POA: Diagnosis not present

## 2016-11-28 DIAGNOSIS — E782 Mixed hyperlipidemia: Secondary | ICD-10-CM

## 2016-11-28 DIAGNOSIS — R69 Illness, unspecified: Secondary | ICD-10-CM | POA: Diagnosis not present

## 2016-11-28 LAB — LIPID PANEL
CHOLESTEROL: 178 mg/dL (ref 0–200)
HDL: 33.3 mg/dL — ABNORMAL LOW (ref >39.00)
LDL CALC: 125 mg/dL — AB (ref 0–99)
NonHDL: 144.29
Total CHOL/HDL Ratio: 5
Triglycerides: 98 mg/dL (ref 0.0–149.0)
VLDL: 19.6 mg/dL (ref 0.0–40.0)

## 2016-11-28 LAB — CBC WITH DIFFERENTIAL/PLATELET
BASOS ABS: 0 10*3/uL (ref 0.0–0.1)
Basophils Relative: 0.5 % (ref 0.0–3.0)
Eosinophils Absolute: 0.1 10*3/uL (ref 0.0–0.7)
Eosinophils Relative: 2.4 % (ref 0.0–5.0)
HCT: 40.3 % (ref 39.0–52.0)
Hemoglobin: 13.2 g/dL (ref 13.0–17.0)
LYMPHS ABS: 2 10*3/uL (ref 0.7–4.0)
Lymphocytes Relative: 34.5 % (ref 12.0–46.0)
MCHC: 32.9 g/dL (ref 30.0–36.0)
MCV: 81.9 fl (ref 78.0–100.0)
Monocytes Absolute: 0.5 10*3/uL (ref 0.1–1.0)
Monocytes Relative: 8.4 % (ref 3.0–12.0)
NEUTROS PCT: 54.2 % (ref 43.0–77.0)
Neutro Abs: 3.1 10*3/uL (ref 1.4–7.7)
PLATELETS: 211 10*3/uL (ref 150.0–400.0)
RBC: 4.92 Mil/uL (ref 4.22–5.81)
RDW: 13.7 % (ref 11.5–15.5)
WBC: 5.8 10*3/uL (ref 4.0–10.5)

## 2016-11-28 LAB — BASIC METABOLIC PANEL
BUN: 10 mg/dL (ref 6–23)
CO2: 31 mEq/L (ref 19–32)
Calcium: 9.6 mg/dL (ref 8.4–10.5)
Chloride: 102 mEq/L (ref 96–112)
Creatinine, Ser: 1.14 mg/dL (ref 0.40–1.50)
GFR: 91.6 mL/min (ref >60.00)
GLUCOSE: 100 mg/dL — AB (ref 70–99)
Potassium: 4.5 mEq/L (ref 3.5–5.1)
Sodium: 141 mEq/L (ref 135–145)

## 2016-11-28 LAB — HEPATIC FUNCTION PANEL
ALK PHOS: 38 U/L — AB (ref 39–117)
ALT: 23 U/L (ref 0–53)
AST: 17 U/L (ref 0–37)
Albumin: 4.6 g/dL (ref 3.5–5.2)
Bilirubin, Direct: 0.2 mg/dL (ref 0.0–0.3)
Total Bilirubin: 1 mg/dL (ref 0.2–1.2)
Total Protein: 7.3 g/dL (ref 6.0–8.3)

## 2016-11-28 LAB — TSH: TSH: 0.7 u[IU]/mL (ref 0.35–4.50)

## 2016-11-28 NOTE — Progress Notes (Signed)
Subjective:    Patient ID: Justin Sanchez, male    DOB: 01/24/1976, 41 y.o.   MRN: [id number]  HPI  Patient presents for yearly preventative medicine examination. He is a pleasant 41 year old male who  has a past medical history of Anemia; Chicken pox; Depression; Drug abuse; Hyperlipidemia; and Schizophrenia (HCC).  All immunizations and health maintenance protocols were reviewed with the patient and needed orders were placed.  Appropriate screening laboratory values were ordered for the patient including screening of hyperlipidemia, renal function and hepatic function.  Medication reconciliation,  past medical history, social history, problem list and allergies were reviewed in detail with the patient  Goals were established with regard to weight loss, exercise, and  diet in compliance with medications. He is eating healthy but has not been exercising as much as he would like   Hyperlipidemia - Controlled on Simvastatin 20 mg   He takes Zyprexa for schizophrenia and is very well controlled  Review of Systems  Constitutional: Negative.   HENT: Negative.   Eyes: Negative.   Respiratory: Negative.   Cardiovascular: Negative.   Gastrointestinal: Negative.   Endocrine: Negative.   Genitourinary: Negative.   Musculoskeletal: Negative.   Skin: Negative.   Allergic/Immunologic: Negative.   Neurological: Negative.   Hematological: Negative.   Psychiatric/Behavioral: Negative.    Past Medical History:  Diagnosis Date  . Anemia    Iron deficinecy - taking iron  . Chicken pox   . Depression    Long time ago - denies currently for 10 years  . Drug abuse   . Hyperlipidemia   . Schizophrenia (HCC)    2002 - well controlled    Social History   Social History  . Marital status: Divorced    Spouse name: [name]  . Number of children: 1  . Years of education: 3414   Occupational History  . Other     Works [employer]   Social History Main Topics  . Smoking  status: Former Smoker    Packs/day: 1.50    Years: 6.00  . Smokeless tobacco: Never Used  . Alcohol use 6.0 oz/week    10 Cans of beer per week  . Drug use: No  . Sexual activity: Yes    Birth control/ protection: Condom   Other Topics Concern  . Not on file   Social History Narrative   Born [place], [place]   [place]   Completed some college   [place]      Cycle and exercise, read,    Diet: eats a little bit of everything, stays away from fried food - eats lots of vegetables.     No past surgical history on file.  Family History  Problem Relation Age of Onset  . Prostate cancer Maternal Uncle     No Known Allergies  Current Outpatient Prescriptions on File Prior to Visit  Medication Sig Dispense Refill  . IRON PO Take by mouth.    . OLANZapine (ZYPREXA) 5 MG tablet TAKE 1 TABLET BY MOUTH DAILY 90 tablet 1  . simvastatin (ZOCOR) 20 MG tablet Take 1 tablet (20 mg total) by mouth daily at 6 PM. 90 tablet 3  . valACYclovir (VALTREX) 500 MG tablet Take 1 tablet (500 mg total) by mouth 2 (two) times daily. 10 tablet 2   No current facility-administered medications on file prior to visit.     BP 132/74 (BP Location: Left Arm, Patient Position: Sitting,  Cuff Size: Normal)   Temp 98.1 F (36.7 C) (Oral)   Ht 5\' 6"  (1.676 m)   Wt 181 lb 9.6 oz (82.4 kg)   BMI 29.31 kg/m       Objective:   Physical Exam  Constitutional: He is oriented to person, place, and time. He appears well-developed and well-nourished. No distress.  HENT:  Head: Normocephalic and atraumatic.  Right Ear: External ear normal.  Left Ear: External ear normal.  Nose: Nose normal.  Mouth/Throat: Oropharynx is clear and moist. No oropharyngeal exudate.  Eyes: Conjunctivae and EOM are normal. Pupils are equal, round, and reactive to light. Right eye exhibits no discharge. Left eye exhibits no discharge. No scleral icterus.  Neck: Normal range of motion. Neck  supple. No JVD present. No tracheal deviation present. No thyromegaly present.  Cardiovascular: Normal rate, regular rhythm, normal heart sounds and intact distal pulses.  Exam reveals no gallop and no friction rub.   No murmur heard. Pulmonary/Chest: Effort normal and breath sounds normal. No stridor. No respiratory distress. He has no wheezes. He has no rales. He exhibits no tenderness.  Abdominal: Soft. Bowel sounds are normal. He exhibits no distension and no mass. There is no tenderness. There is no rebound and no guarding.  Musculoskeletal: Normal range of motion. He exhibits no edema, tenderness or deformity.  Lymphadenopathy:    He has no cervical adenopathy.  Neurological: He is alert and oriented to person, place, and time. He has normal reflexes. He displays normal reflexes. No cranial nerve deficit. He exhibits normal muscle tone. Coordination normal.  Skin: Skin is warm and dry. No rash noted. He is not diaphoretic. No erythema. No pallor.  Psychiatric: He has a normal mood and affect. His behavior is normal. Judgment and thought content normal.  Nursing note and vitals reviewed.     Assessment & Plan:  1. Routine general medical examination at a health care facility  - Healthy male. Continue to eat healthy and exercise - Follow up in one year for CPE  - Basic metabolic panel - CBC with Differential/Platelet - Hepatic function panel - Lipid panel - TSH  2. Mixed hyperlipidemia - Basic metabolic panel - CBC with Differential/Platelet - Hepatic function panel - Lipid panel - TSH - Consider increasing statin   3. Paranoid schizophrenia (HCC) - Controlled on Zyprexa   [name], NP

## 2016-12-25 ENCOUNTER — Encounter: Payer: Self-pay | Admitting: Adult Health

## 2017-01-16 ENCOUNTER — Encounter: Payer: Self-pay | Admitting: Adult Health

## 2017-02-02 DIAGNOSIS — M531 Cervicobrachial syndrome: Secondary | ICD-10-CM | POA: Diagnosis not present

## 2017-02-02 DIAGNOSIS — M9904 Segmental and somatic dysfunction of sacral region: Secondary | ICD-10-CM | POA: Diagnosis not present

## 2017-02-02 DIAGNOSIS — M5386 Other specified dorsopathies, lumbar region: Secondary | ICD-10-CM | POA: Diagnosis not present

## 2017-02-02 DIAGNOSIS — M9902 Segmental and somatic dysfunction of thoracic region: Secondary | ICD-10-CM | POA: Diagnosis not present

## 2017-02-02 DIAGNOSIS — M9903 Segmental and somatic dysfunction of lumbar region: Secondary | ICD-10-CM | POA: Diagnosis not present

## 2017-02-02 DIAGNOSIS — M9901 Segmental and somatic dysfunction of cervical region: Secondary | ICD-10-CM | POA: Diagnosis not present

## 2017-02-02 DIAGNOSIS — M5417 Radiculopathy, lumbosacral region: Secondary | ICD-10-CM | POA: Diagnosis not present

## 2017-02-02 DIAGNOSIS — M5414 Radiculopathy, thoracic region: Secondary | ICD-10-CM | POA: Diagnosis not present

## 2017-02-05 DIAGNOSIS — M5414 Radiculopathy, thoracic region: Secondary | ICD-10-CM | POA: Diagnosis not present

## 2017-02-05 DIAGNOSIS — M531 Cervicobrachial syndrome: Secondary | ICD-10-CM | POA: Diagnosis not present

## 2017-02-05 DIAGNOSIS — M9902 Segmental and somatic dysfunction of thoracic region: Secondary | ICD-10-CM | POA: Diagnosis not present

## 2017-02-05 DIAGNOSIS — M5417 Radiculopathy, lumbosacral region: Secondary | ICD-10-CM | POA: Diagnosis not present

## 2017-02-05 DIAGNOSIS — M5386 Other specified dorsopathies, lumbar region: Secondary | ICD-10-CM | POA: Diagnosis not present

## 2017-02-05 DIAGNOSIS — M9901 Segmental and somatic dysfunction of cervical region: Secondary | ICD-10-CM | POA: Diagnosis not present

## 2017-02-05 DIAGNOSIS — M9904 Segmental and somatic dysfunction of sacral region: Secondary | ICD-10-CM | POA: Diagnosis not present

## 2017-02-05 DIAGNOSIS — M9903 Segmental and somatic dysfunction of lumbar region: Secondary | ICD-10-CM | POA: Diagnosis not present

## 2017-02-06 ENCOUNTER — Encounter: Payer: Self-pay | Admitting: Adult Health

## 2017-02-06 ENCOUNTER — Ambulatory Visit (INDEPENDENT_AMBULATORY_CARE_PROVIDER_SITE_OTHER): Payer: Medicare HMO

## 2017-02-06 ENCOUNTER — Ambulatory Visit (INDEPENDENT_AMBULATORY_CARE_PROVIDER_SITE_OTHER): Payer: Medicare HMO | Admitting: Adult Health

## 2017-02-06 VITALS — BP 110/64 | Temp 98.2°F | Wt 174.0 lb

## 2017-02-06 DIAGNOSIS — M542 Cervicalgia: Secondary | ICD-10-CM | POA: Diagnosis not present

## 2017-02-06 DIAGNOSIS — M47812 Spondylosis without myelopathy or radiculopathy, cervical region: Secondary | ICD-10-CM | POA: Diagnosis not present

## 2017-02-06 NOTE — Patient Instructions (Signed)
It was great seeing you today   Follow up at [facility] for the x ray s  [address] [place], [place] [address]

## 2017-02-06 NOTE — Progress Notes (Signed)
Subjective:    Patient ID: Justin Sanchez, male    DOB: [birth year], 41 y.o.   MRN: [id number]  HPI  41 year old male who  has a past medical history of Anemia; Chicken pox; Depression; Drug abuse; Hyperlipidemia; and Schizophrenia (HCC). He presents to the office today after being seen by his chiropractor for cervical spine pain. 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 His chiropractor would like to have an AP Open mouth lateral cervical and lower cervical   Denies any headaches, blurred vision, or numbness and tingling in his extremities    Review of Systems See HPI   Past Medical History:  Diagnosis Date  . Anemia    Iron deficinecy - taking iron  . Chicken pox   . Depression    Long time ago - denies currently for 10 years  . Drug abuse   . Hyperlipidemia   . Schizophrenia (HCC)    2002 - well controlled    Social History   Social History  . Marital status: Divorced    Spouse name: [name]  . Number of children: 1  . Years of education: 79   Occupational History  . Other     Works [employer]   Social History Main Topics  . Smoking status: Former Smoker    Packs/day: 1.50    Years: 6.00  . Smokeless tobacco: Never Used  . Alcohol use 6.0 oz/week    10 Cans of beer per week  . Drug use: No  . Sexual activity: Yes    Birth control/ protection: Condom   Other Topics Concern  . Not on file   Social History Narrative   Born in [place], [place]   Raised [place], [place]   Completed some college   Family brought to [employer]      Cycle and exercise, read,    Diet: eats a little bit of everything, stays away from fried food - eats lots of vegetables.     No past surgical history on file.  Family History  Problem Relation Age of Onset  . Prostate cancer Maternal Uncle     No Known Allergies  Current Outpatient Prescriptions on File Prior  to Visit  Medication Sig Dispense Refill  . OLANZapine (ZYPREXA) 5 MG tablet TAKE 1 TABLET BY MOUTH DAILY 90 tablet 1  . valACYclovir (VALTREX) 500 MG tablet Take 1 tablet (500 mg total) by mouth 2 (two) times daily. (Patient not taking: Reported on 02/06/2017) 10 tablet 2   No current facility-administered medications on file prior to visit.     BP 110/64 (BP Location: Left Arm)   Temp 98.2 F (36.8 C) (Oral)   Wt 174 lb (78.9 kg)   BMI 28.08 kg/m       Objective:   Physical Exam  Constitutional: He appears well-developed and well-nourished. No distress.  Cardiovascular: Normal rate, regular rhythm, normal heart sounds and intact distal pulses.  Exam reveals no gallop and no friction rub.   No murmur heard. Pulmonary/Chest: Effort normal and breath sounds normal. No respiratory distress. He has no wheezes. He has no rales. He exhibits no tenderness.  Musculoskeletal: Normal range of motion. He exhibits no edema or deformity.  " tenderness" with palpation to cervical spine   Skin: Skin is warm and dry. No rash noted. He is not diaphoretic. No  erythema. No pallor.  Psychiatric: He has a normal mood and affect. His behavior is normal. Judgment and thought content normal.  Nursing note and vitals reviewed.     Assessment & Plan:  1. Cervical spine pain - DG Cervical Spine 2 or 3 views; Future - Will follow up with patient regarding results   [name], NP

## 2017-02-09 DIAGNOSIS — M5417 Radiculopathy, lumbosacral region: Secondary | ICD-10-CM | POA: Diagnosis not present

## 2017-02-09 DIAGNOSIS — M9903 Segmental and somatic dysfunction of lumbar region: Secondary | ICD-10-CM | POA: Diagnosis not present

## 2017-02-09 DIAGNOSIS — M5386 Other specified dorsopathies, lumbar region: Secondary | ICD-10-CM | POA: Diagnosis not present

## 2017-02-09 DIAGNOSIS — M531 Cervicobrachial syndrome: Secondary | ICD-10-CM | POA: Diagnosis not present

## 2017-02-09 DIAGNOSIS — M9902 Segmental and somatic dysfunction of thoracic region: Secondary | ICD-10-CM | POA: Diagnosis not present

## 2017-02-09 DIAGNOSIS — M5414 Radiculopathy, thoracic region: Secondary | ICD-10-CM | POA: Diagnosis not present

## 2017-02-09 DIAGNOSIS — M9901 Segmental and somatic dysfunction of cervical region: Secondary | ICD-10-CM | POA: Diagnosis not present

## 2017-02-09 DIAGNOSIS — M9904 Segmental and somatic dysfunction of sacral region: Secondary | ICD-10-CM | POA: Diagnosis not present

## 2017-02-10 ENCOUNTER — Encounter: Payer: Self-pay | Admitting: Family Medicine

## 2017-02-12 DIAGNOSIS — M531 Cervicobrachial syndrome: Secondary | ICD-10-CM | POA: Diagnosis not present

## 2017-02-12 DIAGNOSIS — M5417 Radiculopathy, lumbosacral region: Secondary | ICD-10-CM | POA: Diagnosis not present

## 2017-02-12 DIAGNOSIS — M5386 Other specified dorsopathies, lumbar region: Secondary | ICD-10-CM | POA: Diagnosis not present

## 2017-02-12 DIAGNOSIS — M9903 Segmental and somatic dysfunction of lumbar region: Secondary | ICD-10-CM | POA: Diagnosis not present

## 2017-02-12 DIAGNOSIS — M9901 Segmental and somatic dysfunction of cervical region: Secondary | ICD-10-CM | POA: Diagnosis not present

## 2017-02-12 DIAGNOSIS — M5414 Radiculopathy, thoracic region: Secondary | ICD-10-CM | POA: Diagnosis not present

## 2017-02-12 DIAGNOSIS — M9902 Segmental and somatic dysfunction of thoracic region: Secondary | ICD-10-CM | POA: Diagnosis not present

## 2017-02-12 DIAGNOSIS — M9904 Segmental and somatic dysfunction of sacral region: Secondary | ICD-10-CM | POA: Diagnosis not present

## 2017-02-19 ENCOUNTER — Other Ambulatory Visit: Payer: Self-pay | Admitting: Family

## 2017-02-19 DIAGNOSIS — M5386 Other specified dorsopathies, lumbar region: Secondary | ICD-10-CM | POA: Diagnosis not present

## 2017-02-19 DIAGNOSIS — M5414 Radiculopathy, thoracic region: Secondary | ICD-10-CM | POA: Diagnosis not present

## 2017-02-19 DIAGNOSIS — M9903 Segmental and somatic dysfunction of lumbar region: Secondary | ICD-10-CM | POA: Diagnosis not present

## 2017-02-19 DIAGNOSIS — M9904 Segmental and somatic dysfunction of sacral region: Secondary | ICD-10-CM | POA: Diagnosis not present

## 2017-02-19 DIAGNOSIS — M531 Cervicobrachial syndrome: Secondary | ICD-10-CM | POA: Diagnosis not present

## 2017-02-19 DIAGNOSIS — M9902 Segmental and somatic dysfunction of thoracic region: Secondary | ICD-10-CM | POA: Diagnosis not present

## 2017-02-19 DIAGNOSIS — M9901 Segmental and somatic dysfunction of cervical region: Secondary | ICD-10-CM | POA: Diagnosis not present

## 2017-02-19 DIAGNOSIS — M5417 Radiculopathy, lumbosacral region: Secondary | ICD-10-CM | POA: Diagnosis not present

## 2017-02-19 NOTE — Telephone Encounter (Signed)
Sent to the pharmacy by e-scribe as instructed. 

## 2017-02-19 NOTE — Telephone Encounter (Signed)
Ok to refill for 6 months 

## 2017-02-20 ENCOUNTER — Other Ambulatory Visit: Payer: Self-pay | Admitting: Family

## 2017-02-23 DIAGNOSIS — M531 Cervicobrachial syndrome: Secondary | ICD-10-CM | POA: Diagnosis not present

## 2017-02-23 DIAGNOSIS — M5417 Radiculopathy, lumbosacral region: Secondary | ICD-10-CM | POA: Diagnosis not present

## 2017-02-23 DIAGNOSIS — M9903 Segmental and somatic dysfunction of lumbar region: Secondary | ICD-10-CM | POA: Diagnosis not present

## 2017-02-23 DIAGNOSIS — M9902 Segmental and somatic dysfunction of thoracic region: Secondary | ICD-10-CM | POA: Diagnosis not present

## 2017-02-23 DIAGNOSIS — M5386 Other specified dorsopathies, lumbar region: Secondary | ICD-10-CM | POA: Diagnosis not present

## 2017-02-23 DIAGNOSIS — M9904 Segmental and somatic dysfunction of sacral region: Secondary | ICD-10-CM | POA: Diagnosis not present

## 2017-02-23 DIAGNOSIS — M9901 Segmental and somatic dysfunction of cervical region: Secondary | ICD-10-CM | POA: Diagnosis not present

## 2017-02-23 DIAGNOSIS — M5414 Radiculopathy, thoracic region: Secondary | ICD-10-CM | POA: Diagnosis not present

## 2017-02-26 DIAGNOSIS — M9901 Segmental and somatic dysfunction of cervical region: Secondary | ICD-10-CM | POA: Diagnosis not present

## 2017-02-26 DIAGNOSIS — M5414 Radiculopathy, thoracic region: Secondary | ICD-10-CM | POA: Diagnosis not present

## 2017-02-26 DIAGNOSIS — M9903 Segmental and somatic dysfunction of lumbar region: Secondary | ICD-10-CM | POA: Diagnosis not present

## 2017-02-26 DIAGNOSIS — M9902 Segmental and somatic dysfunction of thoracic region: Secondary | ICD-10-CM | POA: Diagnosis not present

## 2017-02-26 DIAGNOSIS — M5417 Radiculopathy, lumbosacral region: Secondary | ICD-10-CM | POA: Diagnosis not present

## 2017-02-26 DIAGNOSIS — M5386 Other specified dorsopathies, lumbar region: Secondary | ICD-10-CM | POA: Diagnosis not present

## 2017-02-26 DIAGNOSIS — M531 Cervicobrachial syndrome: Secondary | ICD-10-CM | POA: Diagnosis not present

## 2017-02-26 DIAGNOSIS — M9904 Segmental and somatic dysfunction of sacral region: Secondary | ICD-10-CM | POA: Diagnosis not present

## 2017-03-06 ENCOUNTER — Encounter: Payer: Self-pay | Admitting: Adult Health

## 2017-03-19 DIAGNOSIS — M9902 Segmental and somatic dysfunction of thoracic region: Secondary | ICD-10-CM | POA: Diagnosis not present

## 2017-03-19 DIAGNOSIS — M5417 Radiculopathy, lumbosacral region: Secondary | ICD-10-CM | POA: Diagnosis not present

## 2017-03-19 DIAGNOSIS — M5386 Other specified dorsopathies, lumbar region: Secondary | ICD-10-CM | POA: Diagnosis not present

## 2017-03-19 DIAGNOSIS — M531 Cervicobrachial syndrome: Secondary | ICD-10-CM | POA: Diagnosis not present

## 2017-03-19 DIAGNOSIS — M9903 Segmental and somatic dysfunction of lumbar region: Secondary | ICD-10-CM | POA: Diagnosis not present

## 2017-03-19 DIAGNOSIS — M9904 Segmental and somatic dysfunction of sacral region: Secondary | ICD-10-CM | POA: Diagnosis not present

## 2017-03-19 DIAGNOSIS — M9901 Segmental and somatic dysfunction of cervical region: Secondary | ICD-10-CM | POA: Diagnosis not present

## 2017-03-19 DIAGNOSIS — M5414 Radiculopathy, thoracic region: Secondary | ICD-10-CM | POA: Diagnosis not present

## 2017-03-31 ENCOUNTER — Ambulatory Visit (INDEPENDENT_AMBULATORY_CARE_PROVIDER_SITE_OTHER): Payer: Medicare HMO | Admitting: Adult Health

## 2017-03-31 ENCOUNTER — Encounter: Payer: Self-pay | Admitting: Adult Health

## 2017-03-31 VITALS — BP 118/76 | Temp 98.0°F | Wt 173.0 lb

## 2017-03-31 DIAGNOSIS — M79644 Pain in right finger(s): Secondary | ICD-10-CM | POA: Diagnosis not present

## 2017-03-31 MED ORDER — IBUPROFEN 600 MG PO TABS: 600.0000 mg | ORAL_TABLET | Freq: Three times a day (TID) | ORAL | 1 refills | Status: DC | PRN

## 2017-03-31 NOTE — Progress Notes (Signed)
Subjective:    Patient ID: Justin Sanchez, male    DOB: 1975/07/19, 41 y.o.   MRN: [id number]  HPI  41 year old male who  has a past medical history of Anemia; Chicken pox; Depression; Drug abuse (HCC); Hyperlipidemia; and Schizophrenia (HCC).  He presents to the office today for pain in his right index finger that has been present for 3 weeks. Pain is located in DIP joint. No pain with his finger is extended but does have pain when bending his finger ( like he is pulling a trigger) . No loss of ROM. No known trauma.   He has not been using any OTC medications    Review of Systems See HPI   Past Medical History:  Diagnosis Date  . Anemia    Iron deficinecy - taking iron  . Chicken pox   . Depression    Long time ago - denies currently for 10 years  . Drug abuse (HCC)   . Hyperlipidemia   . Schizophrenia (HCC)    2002 - well controlled    Social History   Social History  . Marital status: Divorced    Spouse name: [name]  . Number of children: 1  . Years of education: 49   Occupational History  . Other     Works [employer]   Social History Main Topics  . Smoking status: Former Smoker    Packs/day: 1.50    Years: 6.00  . Smokeless tobacco: Never Used  . Alcohol use 6.0 oz/week    10 Cans of beer per week  . Drug use: No  . Sexual activity: Yes    Birth control/ protection: Condom   Other Topics Concern  . Not on file   Social History Narrative   Born in [place], [place]   Raised [place], [place]   Completed some college   Family brought to [facility]      Cycle and exercise, read,    Diet: eats a little bit of everything, stays away from fried food - eats lots of vegetables.     No past surgical history on file.  Family History  Problem Relation Age of Onset  . Prostate cancer Maternal Uncle     No Known Allergies  Current Outpatient Prescriptions on File Prior to Visit  Medication Sig Dispense Refill  . OLANZapine (ZYPREXA) 5 MG  tablet TAKE 1 TABLET BY MOUTH DAILY 90 tablet 1  . Multiple Vitamin (MULTIVITAMIN) tablet Take 1 tablet by mouth daily.    . valACYclovir (VALTREX) 500 MG tablet Take 1 tablet (500 mg total) by mouth 2 (two) times daily. (Patient not taking: Reported on 02/06/2017) 10 tablet 2   No current facility-administered medications on file prior to visit.     BP 118/76 (BP Location: Right Arm)   Temp 98 F (36.7 C) (Oral)   Wt 173 lb (78.5 kg)   BMI 27.92 kg/m       Objective:   Physical Exam  Constitutional: He is oriented to person, place, and time. He appears well-developed and well-nourished. No distress.  Musculoskeletal: Normal range of motion. He exhibits no edema, tenderness or deformity.  No swelling, redness, warmth or signs of trauma noted to right index finger. There is no popping or snapping sensation. No loss of ROM   Neurological: He is alert and oriented to person, place, and time.  Skin: Skin is warm and dry. No rash noted. He is not diaphoretic. No erythema. No pallor.  Psychiatric: He has a normal mood and affect. His behavior is normal. Judgment and thought content normal.  Nursing note and vitals reviewed.     Assessment & Plan:  1. Finger pain, right - Does not appear as trigger finger. Possible arthritic changes? Will trial him on Motrin 600 mg Q8H PRN  - ibuprofen (ADVIL,MOTRIN) 600 MG tablet; Take 1 tablet (600 mg total) by mouth every 8 (eight) hours as needed.  Dispense: 90 tablet; Refill: 1 - Follow up if no improvement in 7-10 days   [name], NP

## 2017-04-02 DIAGNOSIS — M9902 Segmental and somatic dysfunction of thoracic region: Secondary | ICD-10-CM | POA: Diagnosis not present

## 2017-04-02 DIAGNOSIS — M9901 Segmental and somatic dysfunction of cervical region: Secondary | ICD-10-CM | POA: Diagnosis not present

## 2017-04-02 DIAGNOSIS — M5417 Radiculopathy, lumbosacral region: Secondary | ICD-10-CM | POA: Diagnosis not present

## 2017-04-02 DIAGNOSIS — M9904 Segmental and somatic dysfunction of sacral region: Secondary | ICD-10-CM | POA: Diagnosis not present

## 2017-04-02 DIAGNOSIS — M9903 Segmental and somatic dysfunction of lumbar region: Secondary | ICD-10-CM | POA: Diagnosis not present

## 2017-04-02 DIAGNOSIS — M5386 Other specified dorsopathies, lumbar region: Secondary | ICD-10-CM | POA: Diagnosis not present

## 2017-04-02 DIAGNOSIS — M531 Cervicobrachial syndrome: Secondary | ICD-10-CM | POA: Diagnosis not present

## 2017-04-02 DIAGNOSIS — M5414 Radiculopathy, thoracic region: Secondary | ICD-10-CM | POA: Diagnosis not present

## 2017-04-08 ENCOUNTER — Encounter: Payer: Self-pay | Admitting: Adult Health

## 2017-04-09 ENCOUNTER — Other Ambulatory Visit: Payer: Self-pay | Admitting: Adult Health

## 2017-04-10 ENCOUNTER — Other Ambulatory Visit: Payer: Self-pay | Admitting: Adult Health

## 2017-04-10 MED ORDER — METHYLPREDNISOLONE 4 MG PO TBPK: ORAL_TABLET | ORAL | 0 refills | Status: DC

## 2017-04-16 DIAGNOSIS — M9904 Segmental and somatic dysfunction of sacral region: Secondary | ICD-10-CM | POA: Diagnosis not present

## 2017-04-16 DIAGNOSIS — M5386 Other specified dorsopathies, lumbar region: Secondary | ICD-10-CM | POA: Diagnosis not present

## 2017-04-16 DIAGNOSIS — M5417 Radiculopathy, lumbosacral region: Secondary | ICD-10-CM | POA: Diagnosis not present

## 2017-04-16 DIAGNOSIS — M5414 Radiculopathy, thoracic region: Secondary | ICD-10-CM | POA: Diagnosis not present

## 2017-04-16 DIAGNOSIS — M9902 Segmental and somatic dysfunction of thoracic region: Secondary | ICD-10-CM | POA: Diagnosis not present

## 2017-04-16 DIAGNOSIS — M9901 Segmental and somatic dysfunction of cervical region: Secondary | ICD-10-CM | POA: Diagnosis not present

## 2017-04-16 DIAGNOSIS — M531 Cervicobrachial syndrome: Secondary | ICD-10-CM | POA: Diagnosis not present

## 2017-04-16 DIAGNOSIS — M9903 Segmental and somatic dysfunction of lumbar region: Secondary | ICD-10-CM | POA: Diagnosis not present

## 2017-05-06 ENCOUNTER — Encounter: Payer: Self-pay | Admitting: Adult Health

## 2017-05-06 ENCOUNTER — Ambulatory Visit (INDEPENDENT_AMBULATORY_CARE_PROVIDER_SITE_OTHER)
Admission: RE | Admit: 2017-05-06 | Discharge: 2017-05-06 | Disposition: A | Discharge status: Home / Self Care | Payer: Medicare HMO | Source: Ambulatory Visit | Attending: Adult Health | Admitting: Adult Health

## 2017-05-06 ENCOUNTER — Ambulatory Visit (INDEPENDENT_AMBULATORY_CARE_PROVIDER_SITE_OTHER): Payer: Medicare HMO | Admitting: Adult Health

## 2017-05-06 VITALS — BP 110/80 | Temp 98.2°F | Wt 174.0 lb

## 2017-05-06 DIAGNOSIS — M79644 Pain in right finger(s): Secondary | ICD-10-CM | POA: Diagnosis not present

## 2017-05-06 DIAGNOSIS — M79641 Pain in right hand: Secondary | ICD-10-CM | POA: Diagnosis not present

## 2017-05-06 NOTE — Progress Notes (Signed)
Subjective:    Patient ID: Justin Sanchez, male    DOB: 03/31/1976, 41 y.o.   MRN: [id number]  HPI  41 year old male who  has a past medical history of Anemia, Chicken pox, Depression, Drug abuse (HCC), Hyperlipidemia, and Schizophrenia (HCC).  He reports to the office for continued pain in his right index finger. I last saw him for this one month ago but has been present daily for greater than 2 months. When I last saw him I prescribed prednisone dose pack which he reports " helped a little." Pain has returned. Reports pain is " more annoying then painful.". He denies any swelling or redness.   Pain is presents with movements and palpation   Review of Systems See HPI   Past Medical History:  Diagnosis Date  . Anemia    Iron deficinecy - taking iron  . Chicken pox   . Depression    Long time ago - denies currently for 10 years  . Drug abuse (HCC)   . Hyperlipidemia   . Schizophrenia (HCC)    2002 - well controlled    Social History   Socioeconomic History  . Marital status: Divorced    Spouse name: Justin Sanchez  . Number of children: 1  . Years of education: 414  . Highest education level: Not on file  Social Needs  . Financial resource strain: Not on file  . Food insecurity - worry: Not on file  . Food insecurity - inability: Not on file  . Transportation needs - medical: Not on file  . Transportation needs - non-medical: Not on file  Occupational History  . Occupation: Other    Comment: [employer]  Tobacco Use  . Smoking status: Former Smoker    Packs/day: 1.50    Years: 6.00    Pack years: 9.00  . Smokeless tobacco: Never Used  Substance and Sexual Activity  . Alcohol use: Yes    Alcohol/week: 6.0 oz    Types: 10 Cans of beer per week  . Drug use: No  . Sexual activity: Yes    Birth control/protection: Condom  Other Topics Concern  . Not on file  Social History Narrative   Born [place]   [place]   Completed some  college   [place] [facility]      Cycle and exercise, read,    Diet: eats a little bit of everything, stays away from fried food - eats lots of vegetables.     History reviewed. No pertinent surgical history.  Family History  Problem Relation Age of Onset  . Prostate cancer Maternal Uncle     No Known Allergies  Current Outpatient Medications on File Prior to Visit  Medication Sig Dispense Refill  . ibuprofen (ADVIL,MOTRIN) 600 MG tablet Take 1 tablet (600 mg total) by mouth every 8 (eight) hours as needed. 90 tablet 1  . Multiple Vitamin (MULTIVITAMIN) tablet Take 1 tablet by mouth daily.    Marland Kitchen. OLANZapine (ZYPREXA) 5 MG tablet TAKE 1 TABLET BY MOUTH DAILY 90 tablet 1  . valACYclovir (VALTREX) 500 MG tablet Take 1 tablet (500 mg total) by mouth 2 (two) times daily. 10 tablet 2   No current facility-administered medications on file prior to visit.     BP 110/80 (BP Location: Left Arm)   Temp 98.2 F (36.8 C) (Oral)   Wt 174 lb (78.9 kg)   BMI 28.08 kg/m  Objective:   Physical Exam  Constitutional: He appears well-developed and well-nourished. No distress.  Musculoskeletal: Normal range of motion. He exhibits tenderness. He exhibits no edema or deformity.  Tenderness with palpation to PIP and DIP joints in right index finger. No swelling or warmth. No snapping or popping sensation.   Skin: Skin is warm and dry. No rash noted. He is not diaphoretic. No erythema. No pallor.  Psychiatric: He has a normal mood and affect. His behavior is normal. Judgment and thought content normal.  Nursing note and vitals reviewed.     Assessment & Plan:  1. Pain of finger of right hand - Probable osteoarthritic pain. Doubt RA at this point.  - DG Hand Complete Right; Future  Shirline Freesory Ethelmae Ringel, NP

## 2017-05-11 ENCOUNTER — Encounter: Payer: Self-pay | Admitting: Adult Health

## 2017-05-12 ENCOUNTER — Other Ambulatory Visit: Payer: Self-pay | Admitting: Adult Health

## 2017-05-12 DIAGNOSIS — M79641 Pain in right hand: Secondary | ICD-10-CM

## 2017-05-12 DIAGNOSIS — M79642 Pain in left hand: Principal | ICD-10-CM

## 2017-05-14 ENCOUNTER — Other Ambulatory Visit (INDEPENDENT_AMBULATORY_CARE_PROVIDER_SITE_OTHER): Payer: Medicare HMO

## 2017-05-14 DIAGNOSIS — M79641 Pain in right hand: Secondary | ICD-10-CM | POA: Diagnosis not present

## 2017-05-14 DIAGNOSIS — M79642 Pain in left hand: Secondary | ICD-10-CM | POA: Diagnosis not present

## 2017-05-14 LAB — SEDIMENTATION RATE: Sed Rate: 3 mm/hr (ref 0–15)

## 2017-05-14 LAB — C-REACTIVE PROTEIN: CRP: 0.1 mg/dL — ABNORMAL LOW (ref 0.5–20.0)

## 2017-05-15 LAB — RHEUMATOID FACTOR: Rhuematoid fact SerPl-aCnc: <14 IU/mL (ref <14)

## 2017-05-28 ENCOUNTER — Ambulatory Visit: Payer: Medicare HMO | Admitting: Adult Health

## 2017-05-29 ENCOUNTER — Encounter: Payer: Self-pay | Admitting: Adult Health

## 2017-05-29 ENCOUNTER — Ambulatory Visit (INDEPENDENT_AMBULATORY_CARE_PROVIDER_SITE_OTHER): Payer: Medicare HMO | Admitting: Adult Health

## 2017-05-29 VITALS — BP 110/76 | Temp 98.6°F | Wt 178.0 lb

## 2017-05-29 DIAGNOSIS — E782 Mixed hyperlipidemia: Secondary | ICD-10-CM

## 2017-05-29 LAB — LIPID PANEL
CHOL/HDL RATIO: 7
CHOLESTEROL: 253 mg/dL — AB (ref 0–200)
HDL: 36.2 mg/dL — ABNORMAL LOW (ref >39.00)
LDL CALC: 193 mg/dL — AB (ref 0–99)
NonHDL: 216.9
TRIGLYCERIDES: 119 mg/dL (ref 0.0–149.0)
VLDL: 23.8 mg/dL (ref 0.0–40.0)

## 2017-05-29 NOTE — Progress Notes (Signed)
Subjective:    Patient ID: Justin HutchingShawn Jolliff, male    DOB: 02/07/1976, 41 y.o.   MRN: [id number]  HPI  41 year old male who  has a past medical history of Anemia, Chicken pox, Depression, Drug abuse (HCC), Hyperlipidemia, and Schizophrenia (HCC). He presents to the office today for 6 month follow up regarding hyperlipidemia. Six months ago he wanted to try coming off his statin. He has been adhearing to a vegetarian diet over the last 3 months and has been doing martial arts on a regular basis.   He understands that Zyprexa can increase his lipid levels    Review of Systems See HPI   Past Medical History:  Diagnosis Date  . Anemia    Iron deficinecy - taking iron  . Chicken pox   . Depression    Long time ago - denies currently for 10 years  . Drug abuse (HCC)   . Hyperlipidemia   . Schizophrenia (HCC)    2002 - well controlled    Social History   Socioeconomic History  . Marital status: Divorced    Spouse name: [name]  . Number of children: 1  . Years of education: 2814  . Highest education level: Not on file  Social Needs  . Financial resource strain: Not on file  . Food insecurity - worry: Not on file  . Food insecurity - inability: Not on file  . Transportation needs - medical: Not on file  . Transportation needs - non-medical: Not on file  Occupational History  . Occupation: Other    Comment: [employer]  Tobacco Use  . Smoking status: Former Smoker    Packs/day: 1.50    Years: 6.00    Pack years: 9.00  . Smokeless tobacco: Never Used  Substance and Sexual Activity  . Alcohol use: Yes    Alcohol/week: 6.0 oz    Types: 10 Cans of beer per week  . Drug use: No  . Sexual activity: Yes    Birth control/protection: Condom  Other Topics Concern  . Not on file  Social History Narrative   Born [place]   [place]   Completed some college   [place] [facility] [place]      Cycle and exercise, read,    Diet: eats a  little bit of everything, stays away from fried food - eats lots of vegetables.     History reviewed. No pertinent surgical history.  Family History  Problem Relation Age of Onset  . Prostate cancer Maternal Uncle     No Known Allergies  Current Outpatient Medications on File Prior to Visit  Medication Sig Dispense Refill  . ibuprofen (ADVIL,MOTRIN) 600 MG tablet Take 1 tablet (600 mg total) by mouth every 8 (eight) hours as needed. 90 tablet 1  . OLANZapine (ZYPREXA) 5 MG tablet TAKE 1 TABLET BY MOUTH DAILY 90 tablet 1  . vitamin B-12 (CYANOCOBALAMIN) 500 MCG tablet Take 500 mcg by mouth daily.    . Multiple Vitamin (MULTIVITAMIN) tablet Take 1 tablet by mouth daily.     No current facility-administered medications on file prior to visit.     BP 110/76 (BP Location: Right Arm)   Temp 98.6 F (37 C) (Oral)   Wt 178 lb (80.7 kg)   BMI 28.73 kg/m       Objective:   Physical Exam  Constitutional: He is oriented to person, place, and time. He appears well-developed and well-nourished. No distress.  Cardiovascular: Normal rate, regular rhythm, normal heart sounds and intact distal pulses. Exam reveals no gallop and no friction rub.  No murmur heard. Pulmonary/Chest: Effort normal and breath sounds normal. No respiratory distress. He has no wheezes. He has no rales. He exhibits no tenderness.  Neurological: He is alert and oriented to person, place, and time.  Skin: Skin is warm and dry. No rash noted. He is not diaphoretic. No erythema. No pallor.  Psychiatric: He has a normal mood and affect. His behavior is normal. Judgment and thought content normal.  Nursing note and vitals reviewed.     Assessment & Plan:  1. Mixed hyperlipidemia - Consider placing back on statin.  - Lipid panel  Shirline Freesory Malea Swilling, NP

## 2017-08-25 ENCOUNTER — Ambulatory Visit: Payer: Medicare HMO | Admitting: Adult Health

## 2017-08-28 ENCOUNTER — Ambulatory Visit: Payer: Medicare HMO | Admitting: Adult Health

## 2017-08-28 ENCOUNTER — Ambulatory Visit (INDEPENDENT_AMBULATORY_CARE_PROVIDER_SITE_OTHER): Payer: Medicare HMO | Admitting: Adult Health

## 2017-08-28 ENCOUNTER — Encounter: Payer: Self-pay | Admitting: Adult Health

## 2017-08-28 VITALS — BP 108/66 | Temp 98.1°F | Wt 173.0 lb

## 2017-08-28 DIAGNOSIS — E639 Nutritional deficiency, unspecified: Secondary | ICD-10-CM | POA: Diagnosis not present

## 2017-08-28 DIAGNOSIS — R5383 Other fatigue: Secondary | ICD-10-CM | POA: Diagnosis not present

## 2017-08-28 DIAGNOSIS — E782 Mixed hyperlipidemia: Secondary | ICD-10-CM | POA: Diagnosis not present

## 2017-08-28 LAB — CBC WITH DIFFERENTIAL/PLATELET
BASOS PCT: 0.4 % (ref 0.0–3.0)
Basophils Absolute: 0 10*3/uL (ref 0.0–0.1)
EOS PCT: 2 % (ref 0.0–5.0)
Eosinophils Absolute: 0.1 10*3/uL (ref 0.0–0.7)
HEMATOCRIT: 39.1 % (ref 39.0–52.0)
Hemoglobin: 13.1 g/dL (ref 13.0–17.0)
LYMPHS ABS: 2 10*3/uL (ref 0.7–4.0)
LYMPHS PCT: 37.2 % (ref 12.0–46.0)
MCHC: 33.6 g/dL (ref 30.0–36.0)
MCV: 82.1 fl (ref 78.0–100.0)
MONOS PCT: 6.1 % (ref 3.0–12.0)
Monocytes Absolute: 0.3 10*3/uL (ref 0.1–1.0)
Neutro Abs: 2.9 10*3/uL (ref 1.4–7.7)
Neutrophils Relative %: 54.3 % (ref 43.0–77.0)
PLATELETS: 206 10*3/uL (ref 150.0–400.0)
RBC: 4.76 Mil/uL (ref 4.22–5.81)
RDW: 13.6 % (ref 11.5–15.5)
WBC: 5.3 10*3/uL (ref 4.0–10.5)

## 2017-08-28 LAB — LIPID PANEL
CHOL/HDL RATIO: 7
Cholesterol: 247 mg/dL — ABNORMAL HIGH (ref 0–200)
HDL: 34.5 mg/dL — ABNORMAL LOW (ref >39.00)
LDL Cholesterol: 174 mg/dL — ABNORMAL HIGH (ref 0–99)
NONHDL: 212.51
Triglycerides: 194 mg/dL — ABNORMAL HIGH (ref 0.0–149.0)
VLDL: 38.8 mg/dL (ref 0.0–40.0)

## 2017-08-28 LAB — VITAMIN D 25 HYDROXY (VIT D DEFICIENCY, FRACTURES): VITD: 14.37 ng/mL — AB (ref 30.00–100.00)

## 2017-08-28 NOTE — Progress Notes (Signed)
Subjective:    Patient ID: Justin HutchingShawn Sanchez, male    DOB: 02/10/1976, 42 y.o.   MRN: [id number]  HPI He presents today for a follow-up of his cholesterol.  He has made lifestyle changes including changing over to a vegetarian diet, addition of high potent B vitamin, black seed oil, Plant-based protein powder.  He briefly incorporated CBD oil into his diet since last visit. His only complaint is of increased fatigue and lack of energy.  He is up and working most of the day form 5 am to 10 pm at night but he falls asleep in his recliner earlier than he feels he should.  He would like [facility] is not anemic since changing over to a vegetarian diet.   Review of Systems  Constitutional: Positive for fatigue. Negative for activity change, appetite change, chills, diaphoresis, fever and unexpected weight change.  Cardiovascular: Negative.   Gastrointestinal: Negative for abdominal pain, blood in stool, constipation, diarrhea, nausea and vomiting.      Past Medical History:  Diagnosis Date  . Anemia    Iron deficinecy - taking iron  . Chicken pox   . Depression    Long time ago - denies currently for 10 years  . Drug abuse (HCC)   . Hyperlipidemia   . Schizophrenia (HCC)    2002 - well controlled    Social History   Socioeconomic History  . Marital status: Divorced    Spouse name: [name]  . Number of children: 1  . Years of education: 5514  . Highest education level: Not on file  Social Needs  . Financial resource strain: Not on file  . Food insecurity - worry: Not on file  . Food insecurity - inability: Not on file  . Transportation needs - medical: Not on file  . Transportation needs - non-medical: Not on file  Occupational History  . Occupation: Other    Comment: [employer]  Tobacco Use  . Smoking status: Former Smoker    Packs/day: 1.50    Years: 6.00    Pack years: 9.00  . Smokeless tobacco: Never Used  Substance and Sexual Activity  . Alcohol  use: Yes    Alcohol/week: 6.0 oz    Types: 10 Cans of beer per week  . Drug use: No  . Sexual activity: Yes    Birth control/protection: Condom  Other Topics Concern  . Not on file  Social History Narrative   Born in [place], [place]   [place]   Completed some college   [place] [facility] [place]      Cycle and exercise, read,    Diet: eats a little bit of everything, stays away from fried food - eats lots of vegetables.     History reviewed. No pertinent surgical history.  Family History  Problem Relation Age of Onset  . Prostate cancer Maternal Uncle     No Known Allergies  Current Outpatient Medications on File Prior to Visit  Medication Sig Dispense Refill  . OLANZapine (ZYPREXA) 5 MG tablet TAKE 1 TABLET BY MOUTH DAILY 90 tablet 1  . ibuprofen (ADVIL,MOTRIN) 600 MG tablet Take 1 tablet (600 mg total) by mouth every 8 (eight) hours as needed. (Patient not taking: Reported on 08/28/2017) 90 tablet 1  . Multiple Vitamin (MULTIVITAMIN) tablet Take 1 tablet by mouth daily.    . vitamin B-12 (CYANOCOBALAMIN) 500 MCG tablet Take 500 mcg by mouth daily.     No  current facility-administered medications on file prior to visit.     BP 108/66 (BP Location: Left Arm, Patient Position: Sitting, Cuff Size: Large)   Temp 98.1 F (36.7 C) (Oral)   Wt 173 lb (78.5 kg)   BMI 27.92 kg/m    Objective:   Physical Exam  Constitutional: He is oriented to person, place, and time. He appears well-developed and well-nourished. No distress.  Cardiovascular: Normal rate, regular rhythm, normal heart sounds and intact distal pulses. Exam reveals no gallop and no friction rub.  No murmur heard. Pulmonary/Chest: Effort normal and breath sounds normal. No respiratory distress. He has no wheezes. He has no rales.  Musculoskeletal: Normal range of motion.  Neurological: He is alert and oriented to person, place, and time.  Skin: Skin is warm and dry. He is not diaphoretic.    Nursing note and vitals reviewed.      Assessment & Plan:  1. Fatigue, unspecified type Will check lab work and notify of results.  May need iron replacement.  History of anemia.  Continue to eat iron rich foods such as leafy green vegetables.  - CBC with Differential/Platelet - Iron and TIBC - Vitamin D, 25-hydroxy  2. Mixed hyperlipidemia Continue with diet and exercise. Will check lab work and notify of results.  May need to initiate statin therapy.  - Lipid panel  Follow-up will be determined after lab work is reviewed.  Corin Formisano C Timmothy Baranowski BSN RN NP student

## 2017-08-28 NOTE — Progress Notes (Addendum)
Subjective:    Patient ID: Justin Sanchez, male    DOB: 11-22-1975, 42 y.o.   MRN: [id number]  HPI 42 year old male who  has a past medical history of Anemia, Chicken pox, Depression, Drug abuse (HCC), Hyperlipidemia, and Schizophrenia (HCC).  Presents to the office today for follow-up regarding hyperlipidemia.  During his last visit we rechecked his cholesterol level taking a break from statins for approximately 6 months.  At this time he was adhering to vegetarian diet and doing aerobic exercise on a consistent basis.  Lab Results  Component Value Date   CHOL 253 (H) 05/29/2017   HDL 36.20 (L) 05/29/2017   LDLCALC 193 (H) 05/29/2017   TRIG 119.0 05/29/2017   CHOLHDL 7 05/29/2017   His cholesterol level had improved and had actually increased since stopping the statin.  This was likely due to Zyprexa.  Upon learning this he was adamant about not restarting his statin and wanted to try CBD oil for 3 months.  I advised him that there is no evidence that proved CBD oil to be effective in reducing cholesterol levels.  Despite this information he refused to go on any cholesterol-lowering medications.  Today in the office he reports that he only used CBD oil for a few weeks and then switched to black seed oil to help lower his cholesterol. He reports that he has been more fatigued over the last few months. He has not been working out as much as he would like too. He is taking a B12 supplement and using a plant based protein.     Review of Systems See HPI   Past Medical History:  Diagnosis Date  . Anemia    Iron deficinecy - taking iron  . Chicken pox   . Depression    Long time ago - denies currently for 10 years  . Drug abuse (HCC)   . Hyperlipidemia   . Schizophrenia (HCC)    2002 - well controlled    Social History   Socioeconomic History  . Marital status: Divorced    Spouse name: Justin Sanchez  . Number of children: 1  . Years of education: 67  . Highest education level: Not  on file  Social Needs  . Financial resource strain: Not on file  . Food insecurity - worry: Not on file  . Food insecurity - inability: Not on file  . Transportation needs - medical: Not on file  . Transportation needs - non-medical: Not on file  Occupational History  . Occupation: Other    Comment: Works Lobbyist  Tobacco Use  . Smoking status: Former Smoker    Packs/day: 1.50    Years: 6.00    Pack years: 9.00  . Smokeless tobacco: Never Used  Substance and Sexual Activity  . Alcohol use: Yes    Alcohol/week: 6.0 oz    Types: 10 Cans of beer per week  . Drug use: No  . Sexual activity: Yes    Birth control/protection: Condom  Other Topics Concern  . Not on file  Social History Narrative   Born in [place], [place]   Raised [place], [place]   Completed some college   Family brought to [facility]      Cycle and exercise, read,    Diet: eats a little bit of everything, stays away from fried food - eats lots of vegetables.     History reviewed. No pertinent surgical history.  Family History  Problem Relation Age of Onset  .  Prostate cancer Maternal Uncle     No Known Allergies  Current Outpatient Medications on File Prior to Visit  Medication Sig Dispense Refill  . OLANZapine (ZYPREXA) 5 MG tablet TAKE 1 TABLET BY MOUTH DAILY 90 tablet 1  . ibuprofen (ADVIL,MOTRIN) 600 MG tablet Take 1 tablet (600 mg total) by mouth every 8 (eight) hours as needed. (Patient not taking: Reported on 08/28/2017) 90 tablet 1  . Multiple Vitamin (MULTIVITAMIN) tablet Take 1 tablet by mouth daily.    . vitamin B-12 (CYANOCOBALAMIN) 500 MCG tablet Take 500 mcg by mouth daily.     No current facility-administered medications on file prior to visit.     BP 108/66 (BP Location: Left Arm, Patient Position: Sitting, Cuff Size: Large)   Temp 98.1 F (36.7 C) (Oral)   Wt 173 lb (78.5 kg)   BMI 27.92 kg/m       Objective:   Physical Exam  Constitutional: He is oriented to  person, place, and time. He appears well-developed and well-nourished. No distress.  Neck: Normal range of motion. Neck supple. No thyromegaly present.  Cardiovascular: Normal rate, regular rhythm, normal heart sounds and intact distal pulses. Exam reveals no gallop and no friction rub.  No murmur heard. Pulmonary/Chest: Effort normal and breath sounds normal. No respiratory distress. He has no wheezes. He has no rales. He exhibits no tenderness.  Neurological: He is alert and oriented to person, place, and time.  Skin: Skin is warm and dry. No rash noted. He is not diaphoretic. No erythema. No pallor.  Psychiatric: He has a normal mood and affect. His behavior is normal.  Nursing note and vitals reviewed.     Assessment & Plan:  1. Fatigue, unspecified type - Possible anemia as he has a history of. Will check on Vitamin D level as well.  - CBC with Differential/Platelet - Iron and TIBC - Vitamin D, 25-hydroxy  2. Mixed hyperlipidemia  - Lipid panel  Shirline Freesory Samone Guhl, AGNP

## 2017-08-29 LAB — IRON,TIBC AND FERRITIN PANEL
%SAT: 35 % (calc) (ref 15–60)
Ferritin: 262 ng/mL (ref 20–380)
IRON: 95 ug/dL (ref 50–180)
TIBC: 271 mcg/dL (calc) (ref 250–425)

## 2017-08-31 ENCOUNTER — Encounter: Payer: Self-pay | Admitting: Adult Health

## 2017-09-01 ENCOUNTER — Encounter: Payer: Self-pay | Admitting: Adult Health

## 2017-10-20 ENCOUNTER — Other Ambulatory Visit: Payer: Self-pay | Admitting: Adult Health

## 2017-10-20 NOTE — Telephone Encounter (Signed)
Do not see that you have filled Zyprexa

## 2017-12-01 ENCOUNTER — Encounter: Payer: Self-pay | Admitting: Adult Health

## 2017-12-01 ENCOUNTER — Ambulatory Visit (INDEPENDENT_AMBULATORY_CARE_PROVIDER_SITE_OTHER): Payer: Medicare HMO

## 2017-12-01 ENCOUNTER — Ambulatory Visit (INDEPENDENT_AMBULATORY_CARE_PROVIDER_SITE_OTHER): Payer: Medicare HMO | Admitting: Adult Health

## 2017-12-01 VITALS — BP 106/68 | Temp 98.3°F | Wt 169.0 lb

## 2017-12-01 DIAGNOSIS — M79641 Pain in right hand: Secondary | ICD-10-CM

## 2017-12-01 DIAGNOSIS — S6991XA Unspecified injury of right wrist, hand and finger(s), initial encounter: Secondary | ICD-10-CM | POA: Diagnosis not present

## 2017-12-01 NOTE — Progress Notes (Signed)
Subjective:    Patient ID: Justin HutchingShawn Sanchez, male    DOB: 03/19/1976, 42 y.o.   MRN: [id number]  HPI  42 year old male who  has a past medical history of Anemia, Chicken pox, Depression, Drug abuse (HCC), Hyperlipidemia, and Schizophrenia (HCC).  She presents to the office today for the acute complaint of right middle finger pain. He reports slamming it in a car door 1.5 weeks ago. Reports pain but no swelling, bruising, or break in skin. He does have ROM but has pain with vertical and horizontal movements.   Review of Systems See HPI   Past Medical History:  Diagnosis Date  . Anemia    Iron deficinecy - taking iron  . Chicken pox   . Depression    Long time ago - denies currently for 10 years  . Drug abuse (HCC)   . Hyperlipidemia   . Schizophrenia (HCC)    2002 - well controlled    Social History   Socioeconomic History  . Marital status: Divorced    Spouse name: [name]  . Number of children: 1  . Years of education: 7514  . Highest education level: Not on file  Occupational History  . Occupation: Other    Comment: Works [employer]  Social Needs  . Financial resource strain: Not on file  . Food insecurity:    Worry: Not on file    Inability: Not on file  . Transportation needs:    Medical: Not on file    Non-medical: Not on file  Tobacco Use  . Smoking status: Former Smoker    Packs/day: 1.50    Years: 6.00    Pack years: 9.00  . Smokeless tobacco: Never Used  Substance and Sexual Activity  . Alcohol use: Yes    Alcohol/week: 6.0 oz    Types: 10 Cans of beer per week  . Drug use: No  . Sexual activity: Yes    Birth control/protection: Condom  Lifestyle  . Physical activity:    Days per week: Not on file    Minutes per session: Not on file  . Stress: Not on file  Relationships  . Social connections:    Talks on phone: Not on file    Gets together: Not on file    Attends religious service: Not on file    Active member of club or  organization: Not on file    Attends meetings of clubs or organizations: Not on file    Relationship status: Not on file  . Intimate partner violence:    Fear of current or ex partner: Not on file    Emotionally abused: Not on file    Physically abused: Not on file    Forced sexual activity: Not on file  Other Topics Concern  . Not on file  Social History Narrative   [place]   [place]   Completed some college   [place] [facility] [place] [place]      Cycle and exercise, read,    Diet: eats a little bit of everything, stays away from fried food - eats lots of vegetables.     History reviewed. No pertinent surgical history.  Family History  Problem Relation Age of Onset  . Prostate cancer Maternal Uncle     No Known Allergies  Current Outpatient Medications on File Prior to Visit  Medication Sig Dispense Refill  . ibuprofen (ADVIL,MOTRIN) 600 MG tablet Take 1 tablet (600 mg total)  by mouth every 8 (eight) hours as needed. 90 tablet 1  . Multiple Vitamin (MULTIVITAMIN) tablet Take 1 tablet by mouth daily.    Marland Kitchen OLANZapine (ZYPREXA) 5 MG tablet TAKE 1 TABLET BY MOUTH EVERY DAY 90 tablet 1  . simvastatin (ZOCOR) 20 MG tablet TAKE 1 TABLET (20 MG TOTAL) BY MOUTH DAILY AT 6 PM. 90 tablet 3  . vitamin B-12 (CYANOCOBALAMIN) 500 MCG tablet Take 500 mcg by mouth daily.     No current facility-administered medications on file prior to visit.     BP 106/68   Temp 98.3 F (36.8 C)   Wt 169 lb (76.7 kg)   BMI 27.28 kg/m       Objective:   Physical Exam  Constitutional: He is oriented to person, place, and time. He appears well-developed and well-nourished. No distress.  Musculoskeletal: Normal range of motion. He exhibits tenderness. He exhibits no edema or deformity.  No deformity noted. No bruising noted  Pain with palpation to PIP joint of right middle finger.   Neurological: He is alert and oriented to person, place, and time.  Skin: He is not  diaphoretic.  Nursing note and vitals reviewed.     Assessment & Plan:  1. Right hand pain - Doubt fracture but due to mechanism of injury will obtain xray.  - DG Hand Complete Right; Future - Can take motrin/Tylenol for symptom relief   [name], NP

## 2017-12-02 ENCOUNTER — Encounter: Payer: Self-pay | Admitting: Adult Health

## 2017-12-02 ENCOUNTER — Other Ambulatory Visit: Payer: Self-pay | Admitting: Adult Health

## 2017-12-02 ENCOUNTER — Telehealth: Payer: Self-pay | Admitting: Adult Health

## 2017-12-02 DIAGNOSIS — R29898 Other symptoms and signs involving the musculoskeletal system: Secondary | ICD-10-CM

## 2017-12-02 NOTE — Telephone Encounter (Signed)
See result note.  

## 2017-12-02 NOTE — Telephone Encounter (Signed)
Copied from CRM [phone]#[id number]. Topic: Quick Communication - Other Results >> Dec 02, 2017 11:24 AM Raj JanusAdkins, Misty T, CMA wrote: Called patient to inform them of x-ray result from 12/01/17. When patient returns call, triage nurse may disclose results.  Patient is returning a miss call in reference to his results  CB # [phone]

## 2017-12-03 ENCOUNTER — Encounter: Payer: Self-pay | Admitting: Adult Health

## 2017-12-03 ENCOUNTER — Ambulatory Visit (INDEPENDENT_AMBULATORY_CARE_PROVIDER_SITE_OTHER): Payer: Medicare HMO

## 2017-12-03 DIAGNOSIS — R29898 Other symptoms and signs involving the musculoskeletal system: Secondary | ICD-10-CM

## 2017-12-03 DIAGNOSIS — R531 Weakness: Secondary | ICD-10-CM | POA: Diagnosis not present

## 2017-12-31 DIAGNOSIS — M720 Palmar fascial fibromatosis [Dupuytren]: Secondary | ICD-10-CM | POA: Insufficient documentation

## 2017-12-31 DIAGNOSIS — M25532 Pain in left wrist: Secondary | ICD-10-CM | POA: Diagnosis not present

## 2017-12-31 DIAGNOSIS — M79641 Pain in right hand: Secondary | ICD-10-CM | POA: Diagnosis not present

## 2018-04-20 ENCOUNTER — Emergency Department (HOSPITAL_COMMUNITY)
Admission: EM | Admit: 2018-04-20 | Discharge: 2018-04-20 | Disposition: A | Discharge status: Home / Self Care | Payer: Worker's Compensation | Attending: Emergency Medicine | Admitting: Emergency Medicine

## 2018-04-20 ENCOUNTER — Emergency Department (HOSPITAL_COMMUNITY): Payer: Worker's Compensation

## 2018-04-20 ENCOUNTER — Encounter (HOSPITAL_COMMUNITY): Payer: Self-pay | Admitting: Emergency Medicine

## 2018-04-20 DIAGNOSIS — W230XXA Caught, crushed, jammed, or pinched between moving objects, initial encounter: Secondary | ICD-10-CM | POA: Insufficient documentation

## 2018-04-20 DIAGNOSIS — Y9389 Activity, other specified: Secondary | ICD-10-CM | POA: Diagnosis not present

## 2018-04-20 DIAGNOSIS — Y990 Civilian activity done for income or pay: Secondary | ICD-10-CM | POA: Diagnosis not present

## 2018-04-20 DIAGNOSIS — S60041A Contusion of right ring finger without damage to nail, initial encounter: Secondary | ICD-10-CM | POA: Diagnosis not present

## 2018-04-20 DIAGNOSIS — Z87891 Personal history of nicotine dependence: Secondary | ICD-10-CM | POA: Diagnosis not present

## 2018-04-20 DIAGNOSIS — Z79899 Other long term (current) drug therapy: Secondary | ICD-10-CM | POA: Diagnosis not present

## 2018-04-20 DIAGNOSIS — S6991XA Unspecified injury of right wrist, hand and finger(s), initial encounter: Secondary | ICD-10-CM | POA: Diagnosis present

## 2018-04-20 DIAGNOSIS — Y929 Unspecified place or not applicable: Secondary | ICD-10-CM | POA: Insufficient documentation

## 2018-04-20 NOTE — ED Notes (Signed)
Waiting for registration to d/c patient at this time.

## 2018-04-20 NOTE — ED Triage Notes (Signed)
Pt reports yesterday at work he shut his right ring finger in door. Reports feels better after patient iced it last night.

## 2018-04-20 NOTE — ED Provider Notes (Signed)
[facility]-EMERGENCY DEPT Provider Note   CSN: [id number] Arrival date & time: 04/20/18  4098     History   Chief Complaint Chief Complaint  Patient presents with  . Finger Injury    HPI Justin Sanchez is a 42 y.o. male.  HPI 42 year old male presents the emergency department with injury to his right ring finger yesterday while at work.  He shut his finger in the car door.  He provided ice to it last night.  He presents for evaluation today.  He reports pain to his middle phalanx of his right ring finger.  No other complaints.  Symptoms are mild in severity.   Past Medical History:  Diagnosis Date  . Anemia    Iron deficinecy - taking iron  . Chicken pox   . Depression    Long time ago - denies currently for 10 years  . Drug abuse (HCC)   . Hyperlipidemia   . Schizophrenia (HCC)    2002 - well controlled    Patient Active Problem List   Diagnosis Date Noted  . Anemia 06/03/2016  . Schizophrenia (HCC) 04/24/2014  . Hyperlipidemia 04/24/2014  . Family history of prostate cancer 03/21/2014    History reviewed. No pertinent surgical history.      Home Medications    Prior to Admission medications   Medication Sig Start Date End Date Taking? Authorizing Provider  ibuprofen (ADVIL,MOTRIN) 600 MG tablet Take 1 tablet (600 mg total) by mouth every 8 (eight) hours as needed. 03/31/17   Nafziger, Kandee Keen, NP  Multiple Vitamin (MULTIVITAMIN) tablet Take 1 tablet by mouth daily.    [provider]  OLANZapine (ZYPREXA) 5 MG tablet TAKE 1 TABLET BY MOUTH EVERY DAY 10/20/17   Nafziger, Kandee Keen, NP  simvastatin (ZOCOR) 20 MG tablet TAKE 1 TABLET (20 MG TOTAL) BY MOUTH DAILY AT 6 PM. 10/20/17   Nafziger, Kandee Keen, NP  vitamin B-12 (CYANOCOBALAMIN) 500 MCG tablet Take 500 mcg by mouth daily.    [provider]    Family History Family History  Problem Relation Age of Onset  . Prostate cancer Maternal Uncle     Social History Social History    Tobacco Use  . Smoking status: Former Smoker    Packs/day: 1.50    Years: 6.00    Pack years: 9.00  . Smokeless tobacco: Never Used  Substance Use Topics  . Alcohol use: Yes    Alcohol/week: 10.0 standard drinks    Types: 10 Cans of beer per week  . Drug use: No     Allergies   Patient has no known allergies.   Review of Systems Review of Systems  All other systems reviewed and are negative.    Physical Exam Updated Vital Signs BP 120/75 (BP Location: Right Arm)   Pulse 62   Temp 98 F (36.7 C) (Oral)   Resp 17   SpO2 95%   Physical Exam  Constitutional: He is oriented to person, place, and time. He appears well-developed and well-nourished.  HENT:  Head: Normocephalic.  Eyes: EOM are normal.  Neck: Normal range of motion.  Pulmonary/Chest: Effort normal.  Abdominal: He exhibits no distension.  Musculoskeletal:  Mild tenderness of the right ring finger middle phalanx without obvious deformity.  Normal flexion and extension of this finger.  Neurological: He is alert and oriented to person, place, and time.  Psychiatric: He has a normal mood and affect.  Nursing note and vitals reviewed.    ED Treatments / Results  Labs (all labs ordered are listed, but only abnormal results are displayed) Labs Reviewed - No data to display  EKG None  Radiology Dg Finger Ring Right  Result Date: 04/20/2018 CLINICAL DATA:  Right fourth finger pain after car door injury yesterday. EXAM: RIGHT RING FINGER 2+V COMPARISON:  Radiographs of December 01, 2017. FINDINGS: There is no evidence of fracture or dislocation. There is no evidence of arthropathy or other focal bone abnormality. Soft tissues are unremarkable. IMPRESSION: Negative. Electronically Signed   By: Lupita Raider, M.D.   On: 04/20/2018 08:50    Procedures .Splint Application Performed by: Azalia Bilis, MD Authorized by: Azalia Bilis, MD     SPLINT APPLICATION Authorized by: Azalia Bilis Consent: Verbal  consent obtained. Risks and benefits: risks, benefits and alternatives were discussed Consent given by: patient Splint applied by: myself Location details: Right ring finger Splint type: Buddy tape Supplies used: Tape Post-procedure: The splinted body part was neurovascularly unchanged following the procedure. Patient tolerance: Patient tolerated the procedure well with no immediate complications.     Medications Ordered in ED Medications - No data to display   Initial Impression / Assessment and Plan / ED Course  I have reviewed the triage vital signs and the nursing notes.  Pertinent labs & imaging results that were available during my care of the patient were reviewed by me and considered in my medical decision making (see chart for details).     Contusion.  X-ray personally reviewed demonstrating no osseous traumatic injury.  Normal flexor and extensor function.  Buddy tape for comfort.  Recommended over-the-counter medications for comfort.  Final Clinical Impressions(s) / ED Diagnoses   Final diagnoses:  Contusion of right ring finger without damage to nail, initial encounter    ED Discharge Orders    None       Azalia Bilis, MD 04/20/18 [phone]

## 2018-04-21 ENCOUNTER — Other Ambulatory Visit: Payer: Self-pay | Admitting: Adult Health

## 2018-04-22 DIAGNOSIS — H5213 Myopia, bilateral: Secondary | ICD-10-CM | POA: Diagnosis not present

## 2018-07-01 ENCOUNTER — Ambulatory Visit (INDEPENDENT_AMBULATORY_CARE_PROVIDER_SITE_OTHER): Payer: Medicare HMO | Admitting: Adult Health

## 2018-07-01 ENCOUNTER — Encounter: Payer: Self-pay | Admitting: Adult Health

## 2018-07-01 ENCOUNTER — Encounter

## 2018-07-01 VITALS — BP 120/80 | Temp 98.0°F | Ht 67.0 in | Wt 178.0 lb

## 2018-07-01 DIAGNOSIS — E559 Vitamin D deficiency, unspecified: Secondary | ICD-10-CM

## 2018-07-01 DIAGNOSIS — E782 Mixed hyperlipidemia: Secondary | ICD-10-CM | POA: Diagnosis not present

## 2018-07-01 DIAGNOSIS — D649 Anemia, unspecified: Secondary | ICD-10-CM

## 2018-07-01 DIAGNOSIS — R69 Illness, unspecified: Secondary | ICD-10-CM | POA: Diagnosis not present

## 2018-07-01 DIAGNOSIS — R5383 Other fatigue: Secondary | ICD-10-CM | POA: Diagnosis not present

## 2018-07-01 DIAGNOSIS — F200 Paranoid schizophrenia: Secondary | ICD-10-CM

## 2018-07-01 DIAGNOSIS — Z0000 Encounter for general adult medical examination without abnormal findings: Secondary | ICD-10-CM | POA: Diagnosis not present

## 2018-07-01 LAB — BASIC METABOLIC PANEL
BUN: 7 mg/dL (ref 6–23)
CO2: 32 mEq/L (ref 19–32)
Calcium: 9.6 mg/dL (ref 8.4–10.5)
Chloride: 102 mEq/L (ref 96–112)
Creatinine, Ser: 1.04 mg/dL (ref 0.40–1.50)
GFR: 95.06 mL/min (ref >60.00)
Glucose, Bld: 88 mg/dL (ref 70–99)
POTASSIUM: 4.7 meq/L (ref 3.5–5.1)
Sodium: 140 mEq/L (ref 135–145)

## 2018-07-01 LAB — HEPATIC FUNCTION PANEL
ALT: 33 U/L (ref 0–53)
AST: 23 U/L (ref 0–37)
Albumin: 4.7 g/dL (ref 3.5–5.2)
Alkaline Phosphatase: 43 U/L (ref 39–117)
BILIRUBIN TOTAL: 0.8 mg/dL (ref 0.2–1.2)
Bilirubin, Direct: 0.1 mg/dL (ref 0.0–0.3)
Total Protein: 7.5 g/dL (ref 6.0–8.3)

## 2018-07-01 LAB — TSH: TSH: 1.83 u[IU]/mL (ref 0.35–4.50)

## 2018-07-01 LAB — CBC WITH DIFFERENTIAL/PLATELET
Basophils Absolute: 0 10*3/uL (ref 0.0–0.1)
Basophils Relative: 0.7 % (ref 0.0–3.0)
EOS PCT: 2.5 % (ref 0.0–5.0)
Eosinophils Absolute: 0.2 10*3/uL (ref 0.0–0.7)
HCT: 39.9 % (ref 39.0–52.0)
Hemoglobin: 13 g/dL (ref 13.0–17.0)
Lymphocytes Relative: 33.5 % (ref 12.0–46.0)
Lymphs Abs: 2.1 10*3/uL (ref 0.7–4.0)
MCHC: 32.7 g/dL (ref 30.0–36.0)
MCV: 82.7 fl (ref 78.0–100.0)
Monocytes Absolute: 0.5 10*3/uL (ref 0.1–1.0)
Monocytes Relative: 8.6 % (ref 3.0–12.0)
NEUTROS PCT: 54.7 % (ref 43.0–77.0)
Neutro Abs: 3.5 10*3/uL (ref 1.4–7.7)
Platelets: 238 10*3/uL (ref 150.0–400.0)
RBC: 4.82 Mil/uL (ref 4.22–5.81)
RDW: 14 % (ref 11.5–15.5)
WBC: 6.4 10*3/uL (ref 4.0–10.5)

## 2018-07-01 LAB — LIPID PANEL
CHOLESTEROL: 223 mg/dL — AB (ref 0–200)
HDL: 41.6 mg/dL (ref >39.00)
LDL CALC: 154 mg/dL — AB (ref 0–99)
NonHDL: 181.31
Total CHOL/HDL Ratio: 5
Triglycerides: 138 mg/dL (ref 0.0–149.0)
VLDL: 27.6 mg/dL (ref 0.0–40.0)

## 2018-07-01 LAB — VITAMIN D 25 HYDROXY (VIT D DEFICIENCY, FRACTURES): VITD: 17.3 ng/mL — ABNORMAL LOW (ref 30.00–100.00)

## 2018-07-01 LAB — VITAMIN B12: Vitamin B-12: 239 pg/mL (ref 211–911)

## 2018-07-01 NOTE — Progress Notes (Signed)
Subjective:    Patient ID: Justin HutchingShawn Heward, male    DOB: 08/21/1975, 43 y.o.   MRN: [id number]  HPI   Patient presents for yearly preventative medicine examination. He is a pleasant [age] year oldwho  has a past medical history of Anemia, Chicken pox, Depression, Drug abuse (HCC), Hyperlipidemia, and Schizophrenia (HCC).  Schizophrenia - well controlled on Zyprexa   Hyperlipidemia - likely due to Zyprexa. Takes Simvastatin 20 mg  Lab Results  Component Value Date   CHOL 247 (H) 08/28/2017   HDL 34.50 (L) 08/28/2017   LDLCALC 174 (H) 08/28/2017   TRIG 194.0 (H) 08/28/2017   CHOLHDL 7 08/28/2017   All immunizations and health maintenance protocols were reviewed with the patient and needed orders were placed. utd  Appropriate screening laboratory values were ordered for the patient including screening of hyperlipidemia, renal function and hepatic function.  Medication reconciliation,  past medical history, social history, problem list and allergies were reviewed in detail with the patient  Goals were established with regard to weight loss, exercise, and  diet in compliance with medications. He continues to eat a vegetarian diet but has not been working out.   Wt Readings from Last 3 Encounters:  07/01/18 178 lb (80.7 kg)  12/01/17 169 lb (76.7 kg)  08/28/17 173 lb (78.5 kg)    His biggest complaint today is that of chronic fatigue over the last few months. He goes to bed at his normal time of 10:30 pm and then wakes up around 7 am. He does not feel rested when he wakes up. Denies snoring or holding his breath when he sleeps. Denies ED. He does have a history of Vitamin D deficiency and also eats a vegetarian diet.   Review of Systems  Constitutional: Positive for fatigue.  HENT: Negative.   Eyes: Negative.   Respiratory: Negative.   Cardiovascular: Negative.   Gastrointestinal: Negative.   Endocrine: Negative.   Genitourinary: Negative.   Musculoskeletal: Negative.   Skin:  Negative.   Allergic/Immunologic: Negative.   Neurological: Negative.   Hematological: Negative.   Psychiatric/Behavioral: Negative.   All other systems reviewed and are negative.  Past Medical History:  Diagnosis Date  . Anemia    Iron deficinecy - taking iron  . Chicken pox   . Depression    Long time ago - denies currently for 10 years  . Drug abuse (HCC)   . Hyperlipidemia   . Schizophrenia (HCC)    2002 - well controlled    Social History   Socioeconomic History  . Marital status: Divorced    Spouse name: [name]  . Number of children: 1  . Years of education: 10914  . Highest education level: Not on file  Occupational History  . Occupation: Other    Comment: Works [employer] operational [employer]  Social Needs  . Financial resource strain: Not on file  . Food insecurity:    Worry: Not on file    Inability: Not on file  . Transportation needs:    Medical: Not on file    Non-medical: Not on file  Tobacco Use  . Smoking status: Former Smoker    Packs/day: 1.50    Years: 6.00    Pack years: 9.00  . Smokeless tobacco: Never Used  Substance and Sexual Activity  . Alcohol use: Yes    Alcohol/week: 10.0 standard drinks    Types: 10 Cans of beer per week  . Drug use: No  . Sexual activity: Yes    Birth  control/protection: Condom  Lifestyle  . Physical activity:    Days per week: Not on file    Minutes per session: Not on file  . Stress: Not on file  Relationships  . Social connections:    Talks on phone: Not on file    Gets together: Not on file    Attends religious service: Not on file    Active member of club or organization: Not on file    Attends meetings of clubs or organizations: Not on file    Relationship status: Not on file  . Intimate partner violence:    Fear of current or ex partner: Not on file    Emotionally abused: Not on file    Physically abused: Not on file    Forced sexual activity: Not on file  Other Topics Concern  . Not on file  Social  History Narrative   Born in [place], [place]   Raised [place], [place]   Completed some college   Family brought to [facility]      Cycle and exercise, read,    Diet: eats a little bit of everything, stays away from fried food - eats lots of vegetables.     History reviewed. No pertinent surgical history.  Family History  Problem Relation Age of Onset  . Prostate cancer Maternal Uncle     No Known Allergies  Current Outpatient Medications on File Prior to Visit  Medication Sig Dispense Refill  . ibuprofen (ADVIL,MOTRIN) 600 MG tablet Take 1 tablet (600 mg total) by mouth every 8 (eight) hours as needed. 90 tablet 1  . Multiple Vitamin (MULTIVITAMIN) tablet Take 1 tablet by mouth daily.    Marland Kitchen OLANZapine (ZYPREXA) 5 MG tablet TAKE 1 TABLET BY MOUTH EVERY DAY 90 tablet 0  . simvastatin (ZOCOR) 20 MG tablet TAKE 1 TABLET (20 MG TOTAL) BY MOUTH DAILY AT 6 PM. 90 tablet 3  . vitamin B-12 (CYANOCOBALAMIN) 500 MCG tablet Take 500 mcg by mouth daily.     No current facility-administered medications on file prior to visit.     BP 120/80   Temp 98 F (36.7 C)   Ht 5\' 7"  (1.702 m)   Wt 178 lb (80.7 kg)   BMI 27.88 kg/m       Objective:   Physical Exam Vitals signs and nursing note reviewed.  Constitutional:      General: He is not in acute distress.    Appearance: Normal appearance. He is normal weight. He is not ill-appearing, toxic-appearing or diaphoretic.  HENT:     Head: Normocephalic and atraumatic.     Right Ear: Tympanic membrane, ear canal and external ear normal. There is no impacted cerumen.     Left Ear: Tympanic membrane, ear canal and external ear normal. There is no impacted cerumen.     Nose: Nose normal. No congestion or rhinorrhea.     Mouth/Throat:     Mouth: Mucous membranes are moist.     Pharynx: Oropharynx is clear.  Eyes:     Extraocular Movements: Extraocular movements intact.     Conjunctiva/sclera: Conjunctivae normal.     Pupils: Pupils are equal,  round, and reactive to light.  Neck:     Musculoskeletal: Normal range of motion and neck supple. No neck rigidity or muscular tenderness.     Vascular: No carotid bruit.  Cardiovascular:     Rate and Rhythm: Normal rate and regular rhythm.     Pulses: Normal pulses.  Heart sounds: Normal heart sounds. No murmur. No friction rub. No gallop.   Pulmonary:     Effort: Pulmonary effort is normal. No respiratory distress.     Breath sounds: Normal breath sounds. No stridor. No wheezing, rhonchi or rales.  Chest:     Chest wall: No tenderness.  Abdominal:     General: Abdomen is flat. Bowel sounds are normal. There is no distension.     Palpations: Abdomen is soft. There is no mass.     Tenderness: There is no abdominal tenderness. There is no right CVA tenderness, left CVA tenderness, guarding or rebound.     Hernia: No hernia is present.  Musculoskeletal: Normal range of motion.        General: No swelling, tenderness, deformity or signs of injury.     Right lower leg: No edema.     Left lower leg: No edema.  Lymphadenopathy:     Cervical: No cervical adenopathy.  Skin:    General: Skin is warm and dry.     Capillary Refill: Capillary refill takes less than 2 seconds.     Coloration: Skin is not jaundiced or pale.     Findings: No bruising, erythema, lesion or rash.  Neurological:     General: No focal deficit present.     Mental Status: He is alert and oriented to person, place, and time. Mental status is at baseline.  Psychiatric:        Mood and Affect: Mood normal.        Behavior: Behavior normal.        Thought Content: Thought content normal.        Judgment: Judgment normal.       Assessment & Plan:  1. Routine general medical examination at a health care facility - Encouraged routine exercise - Continue with diet but make sure he is taking MVI and getting enough protein  - Follow up in one year  - Vitamin D, 25-hydroxy - Basic metabolic panel - CBC with  Differential/Platelet - Hepatic function panel - Lipid panel - TSH - Testosterone Total,Free,Bio, Males - Vitamin B12 - Iron, TIBC and Ferritin Panel  2. Other fatigue - Likely from vitamin D deficiency. Will also check B12 and testosterone  - Vitamin D, 25-hydroxy - Basic metabolic panel - CBC with Differential/Platelet - Hepatic function panel - Lipid panel - TSH - Testosterone Total,Free,Bio, Males - Vitamin B12 - Iron, TIBC and Ferritin Panel  3. Vitamin D deficiency - Likely will need vitamin D supplement  - Vitamin D, 25-hydroxy  4. Anemia, unspecified type  - CBC with Differential/Platelet - Vitamin B12 - Iron, TIBC and Ferritin Panel  5. Paranoid schizophrenia (HCC) - Well controlled on Zyprexa - No change   6. Mixed hyperlipidemia - Consider increase in simvastatin  - Basic metabolic panel - CBC with Differential/Platelet - Hepatic function panel - Lipid panel - TSH  [name], NP

## 2018-07-02 ENCOUNTER — Encounter: Payer: Self-pay | Admitting: Adult Health

## 2018-07-02 ENCOUNTER — Other Ambulatory Visit: Payer: Self-pay | Admitting: Adult Health

## 2018-07-02 LAB — IRON,TIBC AND FERRITIN PANEL
%SAT: 30 % (calc) (ref 20–48)
Ferritin: 187 ng/mL (ref 38–380)
IRON: 91 ug/dL (ref 50–180)
TIBC: 307 mcg/dL (calc) (ref 250–425)

## 2018-07-02 LAB — TESTOSTERONE TOTAL,FREE,BIO, MALES
ALBUMIN MSPROF: 4.5 g/dL (ref 3.6–5.1)
SEX HORMONE BINDING: 28 nmol/L (ref 10–50)
TESTOSTERONE FREE: 80.3 pg/mL (ref 46.0–224.0)
Testosterone, Bioavailable: 165.2 ng/dL (ref >=110.0)
Testosterone: 517 ng/dL (ref 250–827)

## 2018-07-02 MED ORDER — ATORVASTATIN CALCIUM 40 MG PO TABS: 40.0000 mg | ORAL_TABLET | Freq: Every day | ORAL | 3 refills | Status: DC

## 2018-07-29 ENCOUNTER — Encounter: Payer: Self-pay | Admitting: Adult Health

## 2018-07-29 ENCOUNTER — Ambulatory Visit (INDEPENDENT_AMBULATORY_CARE_PROVIDER_SITE_OTHER): Payer: Medicare HMO | Admitting: Adult Health

## 2018-07-29 VITALS — BP 110/70 | Temp 98.2°F | Wt 176.0 lb

## 2018-07-29 DIAGNOSIS — T148XXA Other injury of unspecified body region, initial encounter: Secondary | ICD-10-CM

## 2018-07-29 MED ORDER — CYCLOBENZAPRINE HCL 10 MG PO TABS: 10.0000 mg | ORAL_TABLET | Freq: Every day | ORAL | 0 refills | Status: AC

## 2018-07-29 NOTE — Progress Notes (Signed)
Subjective:    Patient ID: Justin Sanchez, male    DOB: Apr 14, 1976, 43 y.o.   MRN: [id number]  HPI 43 year old male who  has a past medical history of Anemia, Chicken pox, Depression, Drug abuse (HCC), Hyperlipidemia, and Schizophrenia (HCC).  He presents to the office today for an acute issue of right shoulder and neck pain x 7 days. Reports that he was bowling last Friday when the injury occurred. Applied ice and used motrin last night but pain did not resolve.   Denies decreased ROM, headaches, blurred vision, or decreased grip strength    Review of Systems See HPI   Past Medical History:  Diagnosis Date  . Anemia    Iron deficinecy - taking iron  . Chicken pox   . Depression    Long time ago - denies currently for 10 years  . Drug abuse (HCC)   . Hyperlipidemia   . Schizophrenia (HCC)    2002 - well controlled    Social History   Socioeconomic History  . Marital status: Divorced    Spouse name: [name]  . Number of children: 1  . Years of education: 72  . Highest education level: Not on file  Occupational History  . 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 Financial resource strain: Not on file  . Food insecurity:    Worry: Not on file    Inability: Not on file  . Transportation needs:    Medical: Not on file    Non-medical: Not on file  Tobacco Use  . Smoking status: Former Smoker    Packs/day: 1.50    Years: 6.00    Pack years: 9.00  . Smokeless tobacco: Never Used  Substance and Sexual Activity  . Alcohol use: Yes    Alcohol/week: 10.0 standard drinks    Types: 10 Cans of beer per week  . Drug use: No  . Sexual activity: Yes    Birth control/protection: Condom  Lifestyle  . Physical activity:    Days per week: Not on file    Minutes per session: Not on file  . Stress: Not on file  Relationships  . Social connections:    Talks on phone: Not on file    Gets together: Not on file    Attends religious  service: Not on file    Active member of club or organization: Not on file    Attends meetings of clubs or organizations: Not on file    Relationship status: Not on file  . Intimate partner violence:    Fear of current or ex partner: Not on file    Emotionally abused: Not on file    Physically abused: Not on file    Forced sexual activity: Not on file  Other Topics Concern  . Not on file  Social History Narrative   Born in [place], [place]   Raised [place], [place]   Completed some college   Family brought to [facility]      Cycle and exercise, read,    Diet: eats a little bit of everything, stays away from fried food - eats lots of vegetables.     No past surgical history on file.  Family History  Problem Relation Age of Onset  . Prostate cancer Maternal Uncle     No Known Allergies  Current Outpatient Medications on File Prior to Visit  Medication Sig Dispense Refill  . atorvastatin (LIPITOR) 40 MG  tablet Take 1 tablet (40 mg total) by mouth daily. 90 tablet 3  . ibuprofen (ADVIL,MOTRIN) 600 MG tablet Take 1 tablet (600 mg total) by mouth every 8 (eight) hours as needed. 90 tablet 1  . OLANZapine (ZYPREXA) 5 MG tablet TAKE 1 TABLET BY MOUTH EVERY DAY 90 tablet 0  . vitamin B-12 (CYANOCOBALAMIN) 500 MCG tablet Take 500 mcg by mouth daily.     No current facility-administered medications on file prior to visit.     BP 110/70   Temp 98.2 F (36.8 C)   Wt 176 lb (79.8 kg)   BMI 27.57 kg/m       Objective:   Physical Exam Vitals signs and nursing note reviewed.  Constitutional:      Appearance: Normal appearance.  HENT:     Head: Normocephalic and atraumatic.     Right Ear: Tympanic membrane normal.     Left Ear: Tympanic membrane normal.  Cardiovascular:     Rate and Rhythm: Normal rate and regular rhythm.     Pulses: Normal pulses.     Heart sounds: Normal heart sounds.  Pulmonary:     Effort: Pulmonary effort is normal.     Breath sounds: Normal breath  sounds.  Musculoskeletal: Normal range of motion.        General: Tenderness present.     Comments: Along right scapula    Skin:    General: Skin is warm and dry.     Capillary Refill: Capillary refill takes less than 2 seconds.  Neurological:     General: No focal deficit present.     Mental Status: He is alert and oriented to person, place, and time.       Assessment & Plan:  1. Muscle strain - No concern for rotator cuff injury- completely normal ROM.  - Flexeril for muscle strain. Advised heat and motrin PRN  - Follow up if no improvement  - cyclobenzaprine (FLEXERIL) 10 MG tablet; Take 1 tablet (10 mg total) by mouth at bedtime for 10 days.  Dispense: 10 tablet; Refill: 0   [name], NP

## 2018-08-01 ENCOUNTER — Other Ambulatory Visit: Payer: Self-pay | Admitting: Adult Health

## 2018-08-03 NOTE — Telephone Encounter (Signed)
Sent to the pharmacy by e-scribe. 

## 2018-08-10 ENCOUNTER — Encounter: Payer: Self-pay | Admitting: Adult Health

## 2019-01-03 ENCOUNTER — Encounter: Payer: Self-pay | Admitting: Adult Health

## 2019-01-04 ENCOUNTER — Encounter: Payer: Self-pay | Admitting: Adult Health

## 2019-01-04 ENCOUNTER — Other Ambulatory Visit: Payer: Self-pay

## 2019-01-04 ENCOUNTER — Ambulatory Visit (INDEPENDENT_AMBULATORY_CARE_PROVIDER_SITE_OTHER): Payer: Medicare HMO | Admitting: Adult Health

## 2019-01-04 DIAGNOSIS — M79671 Pain in right foot: Secondary | ICD-10-CM

## 2019-01-04 NOTE — Telephone Encounter (Signed)
Pt scheduled to see Tommi Rumps today at 2:30

## 2019-01-04 NOTE — Progress Notes (Signed)
Virtual Visit via Video Note  I connected with Jediah Horger on 01/04/19 at  2:30 PM EDT by a video enabled telemedicine application and verified that I am speaking with the correct person using two identifiers.  Location patient: home Location provider:work or home office Persons participating in the virtual visit: patient, provider  I discussed the limitations of evaluation and management by telemedicine and the availability of in person appointments. The patient expressed understanding and agreed to proceed.   HPI: 43 year old male being evaluated today for an acute issue of right toe pain.  He reports that he was helping a friend move over the weekend and a heavy bed frame fell onto his right foot.  He reports injuring the second third and fourth toes on the right foot.  He denies bruising, discoloration, or edema.  Does not have acute pain but states it feels funny almost like the end of my toe is popping out of place and gets stuck."   ROS: See pertinent positives and negatives per HPI.  Past Medical History:  Diagnosis Date  . Anemia    Iron deficinecy - taking iron  . Chicken pox   . Depression    Long time ago - denies currently for 10 years  . Drug abuse ([place])   . Hyperlipidemia   . Schizophrenia ([place])    2002 - well controlled    No past surgical history on file.  Family History  Problem Relation Age of Onset  . Prostate cancer Maternal Uncle       Current Outpatient Medications:  .  atorvastatin (LIPITOR) 40 MG tablet, Take 1 tablet (40 mg total) by mouth daily., Disp: 90 tablet, Rfl: 3 .  ibuprofen (ADVIL,MOTRIN) 600 MG tablet, Take 1 tablet (600 mg total) by mouth every 8 (eight) hours as needed., Disp: 90 tablet, Rfl: 1 .  OLANZapine (ZYPREXA) 5 MG tablet, TAKE 1 TABLET BY MOUTH EVERY DAY, Disp: 90 tablet, Rfl: 1 .  vitamin B-12 (CYANOCOBALAMIN) 500 MCG tablet, Take 500 mcg by mouth daily., Disp: , Rfl:  .  VITAMIN D PO, Take 5,000 Units by mouth daily.,  Disp: , Rfl:   EXAM:  VITALS per patient if applicable:  GENERAL: alert, oriented, appears well and in no acute distress  HEENT: atraumatic, conjunttiva clear, no obvious abnormalities on inspection of external nose and ears  NECK: normal movements of the head and neck  LUNGS: on inspection no signs of respiratory distress, breathing rate appears normal, no obvious gross SOB, gasping or wheezing  CV: no obvious cyanosis  MS: moves all visible extremities without noticeable abnormality. No bruising, edema, or discoloration noted. He is able to move digits without issue but third digit is unable to bend at DIP joint  PSYCH/NEURO: pleasant and cooperative, no obvious depression or anxiety, speech and thought processing grossly intact  ASSESSMENT AND PLAN:  Discussed the following assessment and plan:  1. Right foot pain - buddy tape - DG Foot Complete Right; Future - consider referral to orthopedics     I discussed the assessment and treatment plan with the patient. The patient was provided an opportunity to ask questions and all were answered. The patient agreed with the plan and demonstrated an understanding of the instructions.   The patient was advised to call back or seek an in-person evaluation if the symptoms worsen or if the condition fails to improve as anticipated.   Dorothyann Peng, NP

## 2019-01-07 ENCOUNTER — Ambulatory Visit (INDEPENDENT_AMBULATORY_CARE_PROVIDER_SITE_OTHER): Payer: Medicare HMO

## 2019-01-07 ENCOUNTER — Other Ambulatory Visit: Payer: Self-pay

## 2019-01-07 ENCOUNTER — Other Ambulatory Visit: Payer: Medicare HMO

## 2019-01-07 DIAGNOSIS — M79671 Pain in right foot: Secondary | ICD-10-CM

## 2019-01-07 DIAGNOSIS — M19071 Primary osteoarthritis, right ankle and foot: Secondary | ICD-10-CM | POA: Diagnosis not present

## 2019-02-24 ENCOUNTER — Other Ambulatory Visit: Payer: Self-pay | Admitting: Adult Health

## 2019-02-24 NOTE — Telephone Encounter (Signed)
Sent to the pharmacy by e-scribe. 

## 2019-03-02 ENCOUNTER — Encounter: Payer: Self-pay | Admitting: Adult Health

## 2019-03-03 NOTE — Telephone Encounter (Signed)
Please advise. Does patient need an OV?

## 2019-04-18 ENCOUNTER — Other Ambulatory Visit: Payer: Self-pay

## 2019-04-18 DIAGNOSIS — Z20822 Contact with and (suspected) exposure to covid-19: Secondary | ICD-10-CM

## 2019-04-19 ENCOUNTER — Encounter: Payer: Self-pay | Admitting: Adult Health

## 2019-04-19 LAB — NOVEL CORONAVIRUS, NAA: SARS-CoV-2, NAA: NOT DETECTED

## 2019-04-20 ENCOUNTER — Telehealth: Payer: Self-pay | Admitting: General Practice

## 2019-04-20 NOTE — Telephone Encounter (Signed)
Negative COVID results given. Patient results "NOT Detected." Caller expressed understanding. ° °

## 2019-05-26 ENCOUNTER — Encounter: Payer: Self-pay | Admitting: Adult Health

## 2019-05-30 DIAGNOSIS — L700 Acne vulgaris: Secondary | ICD-10-CM | POA: Diagnosis not present

## 2019-06-13 ENCOUNTER — Telehealth: Payer: Self-pay

## 2019-06-13 NOTE — Telephone Encounter (Signed)
Copied from [place] [id number]. Topic: Appointment Scheduling - Scheduling Inquiry for Clinic >> Jun 13, 2019  9:34 AM Mathis Bud wrote: Reason for CRM: Patient is requesting a same day appt due to his right arm being sore.  Call back [phone]

## 2019-06-15 ENCOUNTER — Encounter: Payer: Self-pay | Admitting: Adult Health

## 2019-06-15 ENCOUNTER — Other Ambulatory Visit: Payer: Self-pay

## 2019-06-15 ENCOUNTER — Ambulatory Visit (INDEPENDENT_AMBULATORY_CARE_PROVIDER_SITE_OTHER): Payer: Medicare HMO | Admitting: Adult Health

## 2019-06-15 VITALS — BP 118/74 | Temp 98.3°F | Wt 184.0 lb

## 2019-06-15 DIAGNOSIS — T148XXA Other injury of unspecified body region, initial encounter: Secondary | ICD-10-CM | POA: Diagnosis not present

## 2019-06-15 MED ORDER — METHYLPREDNISOLONE 4 MG PO TBPK: ORAL_TABLET | ORAL | 0 refills | Status: DC

## 2019-06-15 MED ORDER — CYCLOBENZAPRINE HCL 10 MG PO TABS: 10.0000 mg | ORAL_TABLET | Freq: Every day | ORAL | 0 refills | Status: DC

## 2019-06-15 NOTE — Progress Notes (Signed)
Subjective:    Patient ID: Justin Sanchez, male    DOB: Mar 18, 1976, 43 y.o.   MRN: [id number]  HPI 43 year old male who  has a past medical history of Anemia, Chicken pox, Depression, Drug abuse (HCC), Hyperlipidemia, and Schizophrenia (HCC).  He presents to the office today with the acute complaint of right wrist and right arm pain.  Reports that pain has been present for approximately 1 month.  Feels as though his wrist is improving but his right forearm is not.  At times he finds it hard to straighten his right arm completely.  So has pain with range of motion exercises.  His pain started soon after bowling.  He also reports working out, [employer] and doing boxing exercises.  Has been using ice, heat, and anti-inflammatories.  Ice seems to help the best.  He does feel as though he has some decreased grip strength   Review of Systems See HPI   Past Medical History:  Diagnosis Date  . Anemia    Iron deficinecy - taking iron  . Chicken pox   . Depression    Long time ago - denies currently for 10 years  . Drug abuse (HCC)   . Hyperlipidemia   . Schizophrenia (HCC)    2002 - well controlled    Social History   Socioeconomic History  . Marital status: Divorced    Spouse name: [name]  . Number of children: 1  . Years of education: 47  . Highest education level: Not on file  Occupational History  . Occupation: Other    Comment: Works Lobbyist  Tobacco Use  . Smoking status: Former Smoker    Packs/day: 1.50    Years: 6.00    Pack years: 9.00  . Smokeless tobacco: Never Used  Substance and Sexual Activity  . Alcohol use: Yes    Alcohol/week: 10.0 standard drinks    Types: 10 Cans of beer per week  . Drug use: No  . Sexual activity: Yes    Birth control/protection: Condom  Other Topics Concern  . Not on file  Social History Narrative   Born in [place], [place]   Raised [place], [place]   Completed some college   Family brought to [facility]      Cycle and exercise, read,    Diet: eats a little bit of everything, stays away from fried food - eats lots of vegetables.    Social Determinants of Health   Financial Resource Strain:   . Difficulty of Paying Living Expenses: Not on file  Food Insecurity:   . Worried About Programme researcher, broadcasting/film/video in the Last Year: Not on file  . Ran Out of Food in the Last Year: Not on file  Transportation Needs:   . Lack of Transportation (Medical): Not on file  . Lack of Transportation (Non-Medical): Not on file  Physical Activity:   . Days of Exercise per Week: Not on file  . Minutes of Exercise per Session: Not on file  Stress:   . Feeling of Stress : Not on file  Social Connections:   . Frequency of Communication with Friends and Family: Not on file  . Frequency of Social Gatherings with Friends and Family: Not on file  . Attends Religious Services: Not on file  . Active Member of Clubs or Organizations: Not on file  . Attends Banker Meetings: Not on file  . Marital Status: Not on file  Intimate Partner  Violence:   . Fear of Current or Ex-Partner: Not on file  . Emotionally Abused: Not on file  . Physically Abused: Not on file  . Sexually Abused: Not on file    History reviewed. No pertinent surgical history.  Family History  Problem Relation Age of Onset  . Prostate cancer Maternal Uncle     No Known Allergies  Current Outpatient Medications on File Prior to Visit  Medication Sig Dispense Refill  . atorvastatin (LIPITOR) 40 MG tablet Take 1 tablet (40 mg total) by mouth daily. 90 tablet 3  . ibuprofen (ADVIL,MOTRIN) 600 MG tablet Take 1 tablet (600 mg total) by mouth every 8 (eight) hours as needed. 90 tablet 1  . OLANZapine (ZYPREXA) 5 MG tablet TAKE 1 TABLET BY MOUTH EVERY DAY 90 tablet 1  . vitamin B-12 (CYANOCOBALAMIN) 500 MCG tablet Take 500 mcg by mouth daily.    Marland Kitchen VITAMIN D PO Take 5,000 Units by mouth daily.     No current facility-administered medications  on file prior to visit.    BP 118/74   Temp 98.3 F (36.8 C) (Temporal)   Wt 184 lb (83.5 kg)   BMI 28.82 kg/m       Objective:   Physical Exam Vitals and nursing note reviewed.  Constitutional:      Appearance: Normal appearance.  Musculoskeletal:        General: Swelling and tenderness present. Normal range of motion.     Comments: There is tenderness and swelling along brachialradialis and brachialis muscles.  There does not appear to be tendon or muscle rupture.  No pain along olecranon   Skin:    General: Skin is warm and dry.     Capillary Refill: Capillary refill takes less than 2 seconds.  Neurological:     General: No focal deficit present.     Mental Status: He is alert and oriented to person, place, and time.     Comments: Mild decreased grip strength   Psychiatric:        Mood and Affect: Mood normal.        Behavior: Behavior normal.        Thought Content: Thought content normal.        Judgment: Judgment normal.       Assessment & Plan:  1. Muscle strain -Likely muscle strain but cannot rule out ligament strain.  Was advised to continue with icing.  Will send in Medrol Dosepak and course of Flexeril to help with discomfort.  Advised rest and elevation.  Follow-up if no improvement over the next week - cyclobenzaprine (FLEXERIL) 10 MG tablet; Take 1 tablet (10 mg total) by mouth at bedtime.  Dispense: 30 tablet; Refill: 0 - methylPREDNISolone (MEDROL DOSEPAK) 4 MG TBPK tablet; Take as directed  Dispense: 21 tablet; Refill: 0   Dorothyann Peng, NP

## 2019-06-16 ENCOUNTER — Encounter: Payer: Self-pay | Admitting: Adult Health

## 2019-06-21 ENCOUNTER — Other Ambulatory Visit: Payer: Self-pay | Admitting: Adult Health

## 2019-06-27 ENCOUNTER — Encounter: Payer: Self-pay | Admitting: Adult Health

## 2019-06-28 ENCOUNTER — Encounter: Payer: Self-pay | Admitting: Adult Health

## 2019-06-28 ENCOUNTER — Other Ambulatory Visit: Payer: Self-pay | Admitting: Adult Health

## 2019-06-28 DIAGNOSIS — M79641 Pain in right hand: Secondary | ICD-10-CM

## 2019-06-28 DIAGNOSIS — M25541 Pain in joints of right hand: Secondary | ICD-10-CM

## 2019-06-28 DIAGNOSIS — M79601 Pain in right arm: Secondary | ICD-10-CM

## 2019-06-28 DIAGNOSIS — T148XXA Other injury of unspecified body region, initial encounter: Secondary | ICD-10-CM

## 2019-07-01 ENCOUNTER — Ambulatory Visit: Payer: Medicare HMO | Attending: Adult Health | Admitting: Physical Therapy

## 2019-07-01 ENCOUNTER — Other Ambulatory Visit: Payer: Self-pay

## 2019-07-01 ENCOUNTER — Encounter: Payer: Self-pay | Admitting: Physical Therapy

## 2019-07-01 DIAGNOSIS — M62838 Other muscle spasm: Secondary | ICD-10-CM

## 2019-07-01 DIAGNOSIS — M6281 Muscle weakness (generalized): Secondary | ICD-10-CM | POA: Diagnosis not present

## 2019-07-03 NOTE — Therapy (Addendum)
[facility] [address], [address] [place], [place], [address] Phone: [phone]   Fax:  [phone]  Physical Therapy Evaluation  Patient Details  Name: Justin Sanchez MRN: [id number] Date of Birth: Jul 05, 1975 Referring Provider (PT): [name], NP   Encounter Date: 07/01/2019  PT End of Session - 07/05/19 1756    Visit Number  1    Date for PT Re-Evaluation  08/26/19    PT Start Time  1016    PT Stop Time  1046    PT Time Calculation (min)  30 min    Activity Tolerance  Patient tolerated treatment well    Behavior During Therapy  [facility] for tasks assessed/performed       Past Medical History:  Diagnosis Date  . Anemia    Iron deficinecy - taking iron  . Chicken pox   . Depression    Long time ago - denies currently for 10 years  . Drug abuse (HCC)   . Hyperlipidemia   . Schizophrenia (HCC)    2002 - well controlled    History reviewed. No pertinent surgical history.  There were no vitals filed for this visit.   Subjective Assessment - 07/05/19 1749    Subjective  Pt states a few days after bowling his arm starting hurting.  He states he has been bowling for a while and this has never happened before so he is not sure that is what caused it.  States it was the wrist was hurting initially and then the whole forearm (pointing to the extensors) hurt .    Patient Stated Goals  be able to do normal activities again without pain    Currently in Pain?  Yes    Pain Score  8     Pain Location  Arm    Pain Orientation  Right;Mid;Lateral    Pain Descriptors / Indicators  Aching    Pain Type  Acute pain    Pain Radiating Towards  lateral elbow down to the wrist    Pain Onset  More than a month ago    Pain Frequency  Intermittent    Aggravating Factors   trying to strengthen my arm; cleaning cars    Pain Relieving Factors  steroid medicine but no longer taking; muscle relaxer unsure if they help    Effect of Pain on  Daily Activities  being able to work    Multiple Pain Sites  No         [facility] PT Assessment - 07/05/19 0001      Assessment   Medical Diagnosis  T14.8XXA (ICD-10-CM) - Muscle strain    Referring Provider (PT)  Nafziger, Kandee Keen, NP    Onset Date/Surgical Date  --   a few weeks   Hand Dominance  Right    Prior Therapy  No      Precautions   Precautions  None      Restrictions   Weight Bearing Restrictions  No      Balance Screen   Has the patient fallen in the past 6 months  No      Home Environment   Living Environment  Private residence      Prior Function   Level of Independence  Independent    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      Cognition   Overall Cognitive Status  Within Functional Limits for tasks assessed      Observation/Other  Assessments   Focus on Therapeutic Outcomes (FOTO)   50% limited   30% goal     ROM / Strength   AROM / PROM / Strength  AROM      AROM   AROM Assessment Site  Elbow    Right/Left Elbow  Right;Left    Right Elbow Extension  15    Left Elbow Extension  5      Strength   Overall Strength Comments  Rt supination 4/5 +pain; Lt 5/5; Rt shoulder flexion in 90 deg 5/5 +pain    Right Wrist Extension  4+/5    Left Wrist Extension  5/5    Right Hand Grip (lbs)  120    Left Hand Grip (lbs)  105      Palpation   Palpation comment  Rt wrist extensors and supinator tight and TTP                Objective measurements completed on examination: See above findings.      [facility] Adult PT Treatment/Exercise - 07/05/19 0001      Modalities   Modalities  Ultrasound      Ultrasound   Ultrasound Location  wrist extensors    Ultrasound Parameters  20% duty; 1.2 intensity; 3.3MHz x 6 min    Ultrasound Goals  Pain               PT Short Term Goals - 07/05/19 1744      PT SHORT TERM GOAL #1   Title  ind with initial HEP    Time  4    Period  Weeks    Status  New    Target Date   07/29/19      PT SHORT TERM GOAL #2   Title  pt will report he is able to work with 30% less pain during typical work day    Time  4    Period  Weeks    Status  New    Target Date  07/29/19        PT Long Term Goals - 07/05/19 1745      PT LONG TERM GOAL #1   Title  pt will report he is able to perform full duties at work with 75% less pain    Time  8    Period  Weeks    Status  New    Target Date  08/26/19      PT LONG TERM GOAL #2   Title  pt will have 5/5 MMT of Rt supination for ability to do his work related tasks    Time  8    Period  Weeks    Status  New    Target Date  08/26/19      PT LONG TERM GOAL #3   Title  Pt will be ind with advanced HEP and able to return to bowling without increased pain    Time  8    Period  Weeks    Status  New    Target Date  08/26/19      PT LONG TERM GOAL #4   Title  Pt will report he is able to get back to lifting moderate level of weight without pain so he can build up to previous strength    Time  8    Period  Weeks    Status  New    Target Date  08/26/19  Plan - 07/05/19 1738    Clinical Impression Statement  Pt presents to clinic due to Rt forearm pain. Pt is Rt handed and is having difficulty performing his work related tasks due to pain.  He is also unable to participate in recreation like normal such as bowling and weight lifting.  Pt has TTP wrist extensors and supinator muscles.  Pt has weak and pain with Rt arm supination.  He has limited elbow extension on the Rt side.  Pt has wrist strength on the Rt of 4+/5 which is less than his nondominant side.  Pt will benefit from skilled PT to address these impairments so he can return to maximum functional activities.    Examination-Participation Restrictions  Community Activity    Stability/Clinical Decision Making  Stable/Uncomplicated    Clinical Decision Making  Low    Rehab Potential  Excellent    PT Frequency  2x / week    PT Duration  8 weeks    PT  Treatment/Interventions  ADLs/Self Care Home Management;Biofeedback;Cryotherapy;Electrical Stimulation;Iontophoresis 4mg /ml Dexamethasone;Moist Heat;Ultrasound;Therapeutic exercise;Therapeutic activities;Neuromuscular re-education;Patient/family education;Manual techniques;Passive range of motion;Dry needling;Taping    PT Next Visit Plan  ionto if order signed; f/u on [place]; STM; forearm strength and ROM    PT Home Exercise Plan  issue at next visit - was given wrist flexion and extension stretches to do, but no handout given    Consulted and Agree with Plan of Care  Patient       Patient will benefit from skilled therapeutic intervention in order to improve the following deficits and impairments:  Pain, Impaired flexibility, Decreased strength, Increased muscle spasms  Visit Diagnosis: Other muscle spasm  Muscle weakness (generalized)     Problem List Patient Active Problem List   Diagnosis Date Noted  . Anemia 06/03/2016  . Schizophrenia ([place]) 04/24/2014  . Hyperlipidemia 04/24/2014  . Family history of prostate cancer 03/21/2014    Jule Ser, PT 07/05/2019, 5:56 PM  [facility] [address] [place], [place], [address] Phone: [phone]   Fax:  [phone]  Name: Justin Sanchez MRN: [id number] Date of Birth: 02/03/1976

## 2019-07-05 NOTE — Addendum Note (Signed)
Addended by: Dwana Curd L on: 07/05/2019 06:00 PM   Modules accepted: Orders

## 2019-07-15 ENCOUNTER — Ambulatory Visit: Payer: Medicare HMO | Admitting: Physical Therapy

## 2019-07-15 ENCOUNTER — Other Ambulatory Visit: Payer: Self-pay

## 2019-07-15 ENCOUNTER — Encounter: Payer: Self-pay | Admitting: Physical Therapy

## 2019-07-15 DIAGNOSIS — M6281 Muscle weakness (generalized): Secondary | ICD-10-CM | POA: Diagnosis not present

## 2019-07-15 DIAGNOSIS — M62838 Other muscle spasm: Secondary | ICD-10-CM | POA: Diagnosis not present

## 2019-07-15 NOTE — Patient Instructions (Addendum)
Access Code: [email or web address]  URL: [email or web address]  Date: 07/15/2019  Prepared by: Jari Favre   Exercises Forearm Pronation and Supination with Hammer - 10 reps - 3 sets - 1x daily - 7x weekly  IONTOPHORESIS PATIENT PRECAUTIONS & CONTRAINDICATIONS:  . Redness under one or both electrodes can occur.  This characterized by a uniform redness that usually disappears within 12 hours of treatment. . Small pinhead size blisters may result in response to the drug.  Contact your physician if the problem persists more than 24 hours. . On rare occasions, iontophoresis therapy can result in temporary skin reactions such as rash, inflammation, irritation or burns.  The skin reactions may be the result of individual sensitivity to the ionic solution used, the condition of the skin at the start of treatment, reaction to the materials in the electrodes, allergies or sensitivity to dexamethasone, or a poor connection between the patch and your skin.  Discontinue using iontophoresis if you have any of these reactions and report to your therapist. . Remove the Patch or electrodes if you have any undue sensation of pain or burning during the treatment and report discomfort to your therapist. . Tell your Therapist if you have had known adverse reactions to the application of electrical current. . If using the Patch, the LED light will turn off when treatment is complete and the patch can be removed.  Approximate treatment time is 1-3 hours.  Remove the patch when light goes off or after 6 hours. . The Patch can be worn during normal activity, however excessive motion where the electrodes have been placed can cause poor contact between the skin and the electrode or uneven electrical current resulting in greater risk of skin irritation. Marland Kitchen Keep out of the reach of children.   . DO NOT use if you have a cardiac pacemaker or any other electrically sensitive implanted device. . DO NOT use if you  have a known sensitivity to dexamethasone. . DO NOT use during Magnetic Resonance Imaging (MRI). . DO NOT use over broken or compromised skin (e.g. sunburn, cuts, or acne) due to the increased risk of skin reaction. . DO NOT SHAVE over the area to be treated:  To establish good contact between the Patch and the skin, excessive hair may be clipped. . DO NOT place the Patch or electrodes on or over your eyes, directly over your heart, or brain. . DO NOT reuse the Patch or electrodes as this may cause burns to occur.  Trigger Point Dry Needling  . What is Trigger Point Dry Needling (DN)? o DN is a physical therapy technique used to treat muscle pain and dysfunction. Specifically, DN helps deactivate muscle trigger points (muscle knots).  o A thin filiform needle is used to penetrate the skin and stimulate the underlying trigger point. The goal is for a local twitch response (LTR) to occur and for the trigger point to relax. No medication of any kind is injected during the procedure.   . What Does Trigger Point Dry Needling Feel Like?  o The procedure feels different for each individual patient. Some patients report that they do not actually feel the needle enter the skin and overall the process is not painful. Very mild bleeding may occur. However, many patients feel a deep cramping in the muscle in which the needle was inserted. This is the local twitch response.   Marland Kitchen How Will I feel after the treatment? o Soreness is normal, and the onset of soreness may not occur for  a few hours. Typically this soreness does not last longer than two days.  o Bruising is uncommon, however; ice can be used to decrease any possible bruising.  o In rare cases feeling tired or nauseous after the treatment is normal. In addition, your symptoms may get worse before they get better, this period will typically not last longer than 24 hours.   . What Can I do After My Treatment? o Increase your hydration by drinking  more water for the next 24 hours. o You may place ice or heat on the areas treated that have become sore, however, do not use heat on inflamed or bruised areas. Heat often brings more relief post needling. o You can continue your regular activities, but vigorous activity is not recommended initially after the treatment for 24 hours. o DN is best combined with other physical therapy such as strengthening, stretching, and other therapies.    [facility] [address], [address] [place], [place] [address] Phone # [phone] Fax [phone]

## 2019-07-15 NOTE — Therapy (Signed)
[facility] [address], [address] [place], [place], [address] Phone: [phone]   Fax:  [phone]  Physical Therapy Treatment  Patient Details  Name: Justin Sanchez MRN: [id number] Date of Birth: 10-25-1975 Referring Provider (PT): [name], NP   Encounter Date: 07/15/2019  PT End of Session - 07/15/19 1049    Visit Number  2    Date for PT Re-Evaluation  08/26/19    PT Start Time  1050    PT Stop Time  1130    PT Time Calculation (min)  40 min    Activity Tolerance  Patient tolerated treatment well    Behavior During Therapy  [facility] for tasks assessed/performed       Past Medical History:  Diagnosis Date  . Anemia    Iron deficinecy - taking iron  . Chicken pox   . Depression    Long time ago - denies currently for 10 years  . Drug abuse (HCC)   . Hyperlipidemia   . Schizophrenia (HCC)    2002 - well controlled    History reviewed. No pertinent surgical history.  There were no vitals filed for this visit.  Subjective Assessment - 07/15/19 1201    Subjective  It is about the same, hurts when I move it and worse after working    Currently in Pain?  No/denies                       [facility] Adult PT Treatment/Exercise - 07/15/19 0001      Exercises   Exercises  Wrist      Wrist Exercises   Other wrist exercises  forearm pronation/supination - 2lb educated and performed      Modalities   Modalities  Iontophoresis      Ultrasound   Ultrasound Location  wrist ext    Ultrasound Parameters  20% duty at 1.2, 3.3MHz    Ultrasound Goals  Pain      Iontophoresis   Type of Iontophoresis  Dexamethasone    Location  distal to lateral epicondyle    Dose  1.0 mL #1    Time  4-6 hour release      Manual Therapy   Manual Therapy  Soft tissue mobilization    Soft tissue mobilization  extensors and supinator Rt side       Trigger Point Dry Needling - 07/15/19 0001    Muscles Treated  Wrist/Hand  Extensor digitorum;Extensor carpi radialis longus/brevis    Extensor carpi radialis longus/brevis Response  Twitch response elicited;Palpable increased muscle length    Extensor digitorum Response  Twitch response elicited;Palpable increased muscle length           PT Education - 07/15/19 1127    Education Details  Access Code: [email or web address]    Person(s) Educated  Patient    Methods  Explanation;Demonstration;Tactile cues;Verbal cues;Handout    Comprehension  Returned demonstration;Verbalized understanding       PT Short Term Goals - 07/05/19 1744      PT SHORT TERM GOAL #1   Title  ind with initial HEP    Time  4    Period  Weeks    Status  New    Target Date  07/29/19      PT SHORT TERM GOAL #2   Title  pt will report he is able to work with 30% less pain during typical work day    Time  4    Period  Weeks    Status  New    Target Date  07/29/19        PT Long Term Goals - 07/05/19 1745      PT LONG TERM GOAL #1   Title  pt will report he is able to perform full duties at work with 75% less pain    Time  8    Period  Weeks    Status  New    Target Date  08/26/19      PT LONG TERM GOAL #2   Title  pt will have 5/5 MMT of Rt supination for ability to do his work related tasks    Time  8    Period  Weeks    Status  New    Target Date  08/26/19      PT LONG TERM GOAL #3   Title  Pt will be ind with advanced HEP and able to return to bowling without increased pain    Time  8    Period  Weeks    Status  New    Target Date  08/26/19      PT LONG TERM GOAL #4   Title  Pt will report he is able to get back to lifting moderate level of weight without pain so he can build up to previous strength    Time  8    Period  Weeks    Status  New    Target Date  08/26/19            Plan - 07/15/19 1150    Clinical Impression Statement  Pt had good response from [place] at first session.  Today's session focused on trigger point release.  He responded well to  treatment with decreased pain after today's session. Pt had increased soft tissue length.  He was able to intiate strengthening of forearm supinator.  Pt will continue to benefit from skilled PT to porgress towards functional goals.    PT Treatment/Interventions  ADLs/Self Care Home Management;Biofeedback;Cryotherapy;Electrical Stimulation;Iontophoresis 4mg /ml Dexamethasone;Moist Heat;Ultrasound;Therapeutic exercise;Therapeutic activities;Neuromuscular re-education;Patient/family education;Manual techniques;Passive range of motion;Dry needling;Taping    PT Next Visit Plan  f/u on ionto, DN, STM, [place], forearm strength as tolerated    PT Home Exercise Plan  Access Code: [email or web address]    Consulted and Agree with Plan of Care  Patient       Patient will benefit from skilled therapeutic intervention in order to improve the following deficits and impairments:  Pain, Impaired flexibility, Decreased strength, Increased muscle spasms  Visit Diagnosis: Other muscle spasm  Muscle weakness (generalized)     Problem List Patient Active Problem List   Diagnosis Date Noted  . Anemia 06/03/2016  . Schizophrenia ([place]) 04/24/2014  . Hyperlipidemia 04/24/2014  . Family history of prostate cancer 03/21/2014    Jule Ser, PT 07/15/2019, 12:01 PM  [facility] [address] [place], [place], [address] Phone: [phone]   Fax:  [phone]  Name: Justin Sanchez MRN: [id number] Date of Birth: 07/01/1975

## 2019-07-22 ENCOUNTER — Ambulatory Visit: Payer: Medicare HMO | Attending: Adult Health | Admitting: Physical Therapy

## 2019-07-22 ENCOUNTER — Other Ambulatory Visit: Payer: Self-pay

## 2019-07-22 ENCOUNTER — Encounter: Payer: Self-pay | Admitting: Physical Therapy

## 2019-07-22 DIAGNOSIS — M62838 Other muscle spasm: Secondary | ICD-10-CM | POA: Diagnosis not present

## 2019-07-22 DIAGNOSIS — M6281 Muscle weakness (generalized): Secondary | ICD-10-CM

## 2019-07-22 NOTE — Therapy (Signed)
[facility] [address], [address] [place], [place], [address] Phone: [phone]   Fax:  [phone]  Physical Therapy Treatment  Patient Details  Name: Justin Sanchez MRN: [id number] Date of Birth: 02-Jul-1975 Referring Provider (PT): [name], NP   Encounter Date: 07/22/2019  PT End of Session - 07/22/19 0845    Visit Number  3    Date for PT Re-Evaluation  08/26/19    PT Start Time  0845    PT Stop Time  0922    PT Time Calculation (min)  37 min    Activity Tolerance  Patient tolerated treatment well    Behavior During Therapy  [facility] for tasks assessed/performed       Past Medical History:  Diagnosis Date  . Anemia    Iron deficinecy - taking iron  . Chicken pox   . Depression    Long time ago - denies currently for 10 years  . Drug abuse (HCC)   . Hyperlipidemia   . Schizophrenia (HCC)    2002 - well controlled    History reviewed. No pertinent surgical history.  There were no vitals filed for this visit.  Subjective Assessment - 07/22/19 0924    Subjective  It feels a little better.    Currently in Pain?  Yes    Pain Score  5     Pain Location  Arm    Pain Orientation  Right;Mid;Lateral    Pain Descriptors / Indicators  Discomfort    Pain Type  Acute pain    Pain Radiating Towards  lateral elbow    Pain Onset  More than a month ago    Multiple Pain Sites  No                       [facility] Adult PT Treatment/Exercise - 07/22/19 0001      Ultrasound   Ultrasound Location  wrist extensors Rt    Ultrasound Parameters  20%; 1.2; 3.3    Ultrasound Goals  Pain      Iontophoresis   Type of Iontophoresis  Dexamethasone    Location  distal to lateral epicondyle    Dose  1.0 mL #1    Time  4-6 hour release      Manual Therapy   Soft tissue mobilization  extensors and supinator Rt side       Trigger Point Dry Needling - 07/22/19 0001    Extensor carpi radialis longus/brevis Response   Twitch response elicited;Palpable increased muscle length    Extensor digitorum Response  Twitch response elicited;Palpable increased muscle length             PT Short Term Goals - 07/22/19 0925      PT SHORT TERM GOAL #1   Title  ind with initial HEP    Status  Achieved      PT SHORT TERM GOAL #2   Title  pt will report he is able to work with 30% less pain during typical work day    Baseline  improving, no number given    Status  On-going        PT Long Term Goals - 07/05/19 1745      PT LONG TERM GOAL #1   Title  pt will report he is able to perform full duties at work with 75% less pain    Time  8    Period  Weeks    Status  New    Target Date  08/26/19      PT LONG TERM GOAL #2   Title  pt will have 5/5 MMT of Rt supination for ability to do his work related tasks    Time  8    Period  Weeks    Status  New    Target Date  08/26/19      PT LONG TERM GOAL #3   Title  Pt will be ind with advanced HEP and able to return to bowling without increased pain    Time  8    Period  Weeks    Status  New    Target Date  08/26/19      PT LONG TERM GOAL #4   Title  Pt will report he is able to get back to lifting moderate level of weight without pain so he can build up to previous strength    Time  8    Period  Weeks    Status  New    Target Date  08/26/19            Plan - 07/22/19 0921    Clinical Impression Statement  Todays's session continue to focus on trigger point and soft tissue release.  He is responding well to manual and dry needling with improved soft tissue length. Pt is going to progress strength with increased reps.  Pt will benefit from skilled PT to continue to progress strength as tolerated.    PT Treatment/Interventions  ADLs/Self Care Home Management;Biofeedback;Cryotherapy;Electrical Stimulation;Iontophoresis 4mg /ml Dexamethasone;Moist Heat;Ultrasound;Therapeutic exercise;Therapeutic activities;Neuromuscular re-education;Patient/family  education;Manual techniques;Passive range of motion;Dry needling;Taping    PT Next Visit Plan  f/u on ionto, DN, STM, [place], forearm strength as tolerated    PT Home Exercise Plan  Access Code: [email or web address]    Consulted and Agree with Plan of Care  Patient       Patient will benefit from skilled therapeutic intervention in order to improve the following deficits and impairments:  Pain, Impaired flexibility, Decreased strength, Increased muscle spasms  Visit Diagnosis: Other muscle spasm  Muscle weakness (generalized)     Problem List Patient Active Problem List   Diagnosis Date Noted  . Anemia 06/03/2016  . Schizophrenia ([place]) 04/24/2014  . Hyperlipidemia 04/24/2014  . Family history of prostate cancer 03/21/2014    Jule Ser, PT 07/22/2019, 9:28 AM  [facility] [address] [place], [place], [address] Phone: [phone]   Fax:  [phone]  Name: Justin Sanchez MRN: [id number] Date of Birth: August 04, 1975

## 2019-07-29 ENCOUNTER — Ambulatory Visit: Payer: Medicare HMO | Admitting: Physical Therapy

## 2019-07-29 ENCOUNTER — Encounter: Payer: Self-pay | Admitting: Physical Therapy

## 2019-07-29 ENCOUNTER — Other Ambulatory Visit: Payer: Self-pay

## 2019-07-29 DIAGNOSIS — M62838 Other muscle spasm: Secondary | ICD-10-CM | POA: Diagnosis not present

## 2019-07-29 DIAGNOSIS — M6281 Muscle weakness (generalized): Secondary | ICD-10-CM

## 2019-07-29 NOTE — Patient Instructions (Signed)
Access Code: [email or web address]  URL: [email or web address]  Date: 07/29/2019  Prepared by: Dwana Curd   Exercises Forearm Pronation and Supination with Hammer - 10 reps - 3 sets - 1x daily - 7x weekly Seated Wrist Flexion with Dumbbell - 10 reps - 3 sets - 1x daily - 7x weekly Seated Wrist Extension with Dumbbell - 10 reps - 3 sets - 1x daily - 7x weekly

## 2019-07-29 NOTE — Therapy (Signed)
[facility] [address], [place], [place], [address] Phone: [phone]   Fax:  [phone]  Physical Therapy Treatment  Patient Details  Name: Justin Sanchez MRN: [id number] Date of Birth: 1976/06/09 Referring Provider (PT): Dorothyann Peng, NP   Encounter Date: 07/29/2019  PT End of Session - 07/29/19 0843    Visit Number  4    Date for PT Re-Evaluation  08/26/19    PT Start Time  0843    PT Stop Time  0921    PT Time Calculation (min)  38 min    Activity Tolerance  Patient tolerated treatment well    Behavior During Therapy  [facility] for tasks assessed/performed       Past Medical History:  Diagnosis Date  . Anemia    Iron deficinecy - taking iron  . Chicken pox   . Depression    Long time ago - denies currently for 10 years  . Drug abuse ([place])   . Hyperlipidemia   . Schizophrenia ([place])    2002 - well controlled    History reviewed. No pertinent surgical history.  There were no vitals filed for this visit.  Subjective Assessment - 07/29/19 0848    Subjective  It feels about 50% better.  When I am working I still transfer things to the left arm.    Patient Stated Goals  be able to do normal activities again without pain    Currently in Pain?  Yes    Pain Score  4     Pain Location  Arm    Pain Orientation  Right;Mid;Lateral    Pain Descriptors / Indicators  Discomfort    Pain Type  Acute pain    Pain Onset  More than a month ago    Pain Frequency  Intermittent    Multiple Pain Sites  No                       [facility] Adult PT Treatment/Exercise - 07/29/19 0001      Wrist Exercises   Other wrist exercises  forearm pronation/supination - 3lb educated and performed    Other wrist exercises  wrist flex and ext with eccentric lowering - 30x each 3lb      Modalities   Modalities  Iontophoresis      Ultrasound   Ultrasound Location  wrist ext    Ultrasound Parameters  20%; 1.2; 52mHz    Ultrasound Goals  Pain      Iontophoresis   Type of Iontophoresis  Dexamethasone    Location  distal to lateral epicondyle    Dose  1.0 mL #3    Time  4-6 hour release      Manual Therapy   Soft tissue mobilization  extensors and supinator Rt side       Trigger Point Dry Needling - 07/29/19 0001    Extensor carpi radialis longus/brevis Response  Twitch response elicited;Palpable increased muscle length    Extensor digitorum Response  Twitch response elicited;Palpable increased muscle length           PT Education - 07/29/19 0925    Education Details  Access Code: [email or web address]    Person(s) Educated  Patient    Methods  Explanation;Demonstration;Handout;Verbal cues    Comprehension  Verbalized understanding;Returned demonstration       PT Short Term Goals - 07/22/19 0925      PT SHORT TERM GOAL #1   Title  ind with  initial HEP    Status  Achieved      PT SHORT TERM GOAL #2   Title  pt will report he is able to work with 30% less pain during typical work day    Baseline  improving, no number given    Status  On-going        PT Long Term Goals - 07/05/19 1745      PT LONG TERM GOAL #1   Title  pt will report he is able to perform full duties at work with 75% less pain    Time  8    Period  Weeks    Status  New    Target Date  08/26/19      PT LONG TERM GOAL #2   Title  pt will have 5/5 MMT of Rt supination for ability to do his work related tasks    Time  8    Period  Weeks    Status  New    Target Date  08/26/19      PT LONG TERM GOAL #3   Title  Pt will be ind with advanced HEP and able to return to bowling without increased pain    Time  8    Period  Weeks    Status  New    Target Date  08/26/19      PT LONG TERM GOAL #4   Title  Pt will report he is able to get back to lifting moderate level of weight without pain so he can build up to previous strength    Time  8    Period  Weeks    Status  New    Target Date  08/26/19            Plan -  07/29/19 0925    Clinical Impression Statement  Pt was able to progress strenghening today.  He is 50% improved as self reported.  Pt will continue to benefit from pain management and improved strength for full return to work and exercise activities.    PT Treatment/Interventions  ADLs/Self Care Home Management;Biofeedback;Cryotherapy;Electrical Stimulation;Iontophoresis 4mg /ml Dexamethasone;Moist Heat;Ultrasound;Therapeutic exercise;Therapeutic activities;Neuromuscular re-education;Patient/family education;Manual techniques;Passive range of motion;Dry needling;Taping    PT Next Visit Plan  progress wrist strength add rows and ext, DN, STM, , forearm strength as tolerated    PT Home Exercise Plan  Access Code: [email or web address]    Consulted and Agree with Plan of Care  Patient       Patient will benefit from skilled therapeutic intervention in order to improve the following deficits and impairments:  Pain, Impaired flexibility, Decreased strength, Increased muscle spasms  Visit Diagnosis: Other muscle spasm  Muscle weakness (generalized)     Problem List Patient Active Problem List   Diagnosis Date Noted  . Anemia 06/03/2016  . Schizophrenia (HCC) 04/24/2014  . Hyperlipidemia 04/24/2014  . Family history of prostate cancer 03/21/2014    05/21/2014, PT 07/29/2019, 9:29 AM  [facility] [address] [place], [place], [place] Phone: [phone]   Fax:  [phone]  Name: Masaru Chamberlin MRN: [name] Date of Birth: Jan 07, 1976

## 2019-08-05 ENCOUNTER — Encounter: Payer: Self-pay | Admitting: Physical Therapy

## 2019-08-05 ENCOUNTER — Other Ambulatory Visit: Payer: Self-pay

## 2019-08-05 ENCOUNTER — Ambulatory Visit: Payer: Medicare HMO | Admitting: Physical Therapy

## 2019-08-05 DIAGNOSIS — M62838 Other muscle spasm: Secondary | ICD-10-CM | POA: Diagnosis not present

## 2019-08-05 DIAGNOSIS — M6281 Muscle weakness (generalized): Secondary | ICD-10-CM

## 2019-08-05 NOTE — Therapy (Signed)
[facility] [address] [place], [place], [address] Phone: [phone]   Fax:  [phone]  Physical Therapy Treatment  Patient Details  Name: Justin Sanchez MRN: [id number] Date of Birth: [birth year] Referring Provider (PT): Dorothyann Peng, NP   Encounter Date: 08/05/2019  PT End of Session - 08/05/19 0851    Visit Number  6    Date for PT Re-Evaluation  08/26/19    PT Start Time  0849    PT Stop Time  0923    PT Time Calculation (min)  34 min    Activity Tolerance  Patient tolerated treatment well    Behavior During Therapy  [facility] for tasks assessed/performed       Past Medical History:  Diagnosis Date  . Anemia    Iron deficinecy - taking iron  . Chicken pox   . Depression    Long time ago - denies currently for 10 years  . Drug abuse ([place])   . Hyperlipidemia   . Schizophrenia ([place])    2002 - well controlled    History reviewed. No pertinent surgical history.  There were no vitals filed for this visit.  Subjective Assessment - 08/05/19 0849    Subjective  Just work still irritates it.  I feel better and no pain currently.  Pain gets up to a 6/10 when I work.    Patient Stated Goals  be able to do normal activities again without pain    Currently in Pain?  No/denies                       [facility] Adult PT Treatment/Exercise - 08/05/19 0001      Shoulder Exercises: Standing   Extension  Strengthening;Both;20 reps;Theraband    Theraband Level (Shoulder Extension)  Level 4 (Blue)   keeping wrists straight   Row  Strengthening;Both;20 reps;Theraband    Theraband Level (Shoulder Row)  Level 4 (Blue)   wrists straight     Wrist Exercises   Other wrist exercises  forearm pronation/supination - 3lb educated and performed    Other wrist exercises  wrist flex and ext with eccentric lowering - 30x each 4lb      Modalities   Modalities  Iontophoresis      Ultrasound   Ultrasound Location   wrist ext    Ultrasound Parameters  20%; 1.2; 1MHz    Ultrasound Goals  Pain      Iontophoresis   Type of Iontophoresis  Dexamethasone    Location  distal to lateral epicondyle    Dose  1.0 mL #3    Time  4-6 hour release      Manual Therapy   Manual Therapy  --    Soft tissue mobilization  --               PT Short Term Goals - 07/22/19 2637      PT SHORT TERM GOAL #1   Title  ind with initial HEP    Status  Achieved      PT SHORT TERM GOAL #2   Title  pt will report he is able to work with 30% less pain during typical work day    Baseline  improving, no number given    Status  On-going        PT Long Term Goals - 07/05/19 1745      PT LONG TERM GOAL #1   Title  pt will report he  is able to perform full duties at work with 75% less pain    Time  8    Period  Weeks    Status  New    Target Date  08/26/19      PT LONG TERM GOAL #2   Title  pt will have 5/5 MMT of Rt supination for ability to do his work related tasks    Time  8    Period  Weeks    Status  New    Target Date  08/26/19      PT LONG TERM GOAL #3   Title  Pt will be ind with advanced HEP and able to return to bowling without increased pain    Time  8    Period  Weeks    Status  New    Target Date  08/26/19      PT LONG TERM GOAL #4   Title  Pt will report he is able to get back to lifting moderate level of weight without pain so he can build up to previous strength    Time  8    Period  Weeks    Status  New    Target Date  08/26/19            Plan - 08/05/19 0926    Clinical Impression Statement  Pt continues to make progress with increased resistance.  He is able to supinate his forearm without pain today.  Pt will benefit from skilled PT to continue to progress strength in order to return to full funtion at work without increased pain.    PT Treatment/Interventions  ADLs/Self Care Home Management;Biofeedback;Cryotherapy;Electrical Stimulation;Iontophoresis 4mg /ml  Dexamethasone;Moist Heat;Ultrasound;Therapeutic exercise;Therapeutic activities;Neuromuscular re-education;Patient/family education;Manual techniques;Passive range of motion;Dry needling;Taping    PT Next Visit Plan  progress wrist strength add rows and ext to HEP, DN, STM, , forearm strength as tolerated    PT Home Exercise Plan  Access Code: [email or web address]    Consulted and Agree with Plan of Care  Patient       Patient will benefit from skilled therapeutic intervention in order to improve the following deficits and impairments:  Pain, Impaired flexibility, Decreased strength, Increased muscle spasms  Visit Diagnosis: Other muscle spasm  Muscle weakness (generalized)     Problem List Patient Active Problem List   Diagnosis Date Noted  . Anemia 06/03/2016  . Schizophrenia ([facility]) 04/24/2014  . Hyperlipidemia 04/24/2014  . Family history of prostate cancer 03/21/2014    05/21/2014, PT 08/05/2019, 9:29 AM  [facility] [address] [place], [place], [place] Phone: [phone]   Fax:  [phone]  Name: Stephan Nelis MRN: [name] Date of Birth: 06-21-1975

## 2019-08-12 ENCOUNTER — Ambulatory Visit: Payer: Medicare HMO | Admitting: Physical Therapy

## 2019-08-12 ENCOUNTER — Other Ambulatory Visit: Payer: Self-pay

## 2019-08-12 ENCOUNTER — Encounter: Payer: Self-pay | Admitting: Physical Therapy

## 2019-08-12 DIAGNOSIS — M6281 Muscle weakness (generalized): Secondary | ICD-10-CM

## 2019-08-12 DIAGNOSIS — M62838 Other muscle spasm: Secondary | ICD-10-CM | POA: Diagnosis not present

## 2019-08-12 NOTE — Therapy (Signed)
[facility] [address] [place], [place], [address] Phone: [phone]   Fax:  [phone]  Physical Therapy Treatment  Patient Details  Name: Justin Sanchez MRN: [id number] Date of Birth: 08/13/1975 Referring Provider (PT): [name], NP   Encounter Date: 08/12/2019  PT End of Session - 08/12/19 0849    Visit Number  7    Date for PT Re-Evaluation  08/26/19    PT Start Time  0843    PT Stop Time  0927    PT Time Calculation (min)  44 min    Activity Tolerance  Patient tolerated treatment well    Behavior During Therapy  [facility] for tasks assessed/performed       Past Medical History:  Diagnosis Date  . Anemia    Iron deficinecy - taking iron  . Chicken pox   . Depression    Long time ago - denies currently for 10 years  . Drug abuse (HCC)   . Hyperlipidemia   . Schizophrenia (HCC)    2002 - well controlled    History reviewed. No pertinent surgical history.  There were no vitals filed for this visit.  Subjective Assessment - 08/12/19 0846    Subjective  Sunday I worked and Monday my arm was feeling great.  But it still hurts at work but a little better.    Patient Stated Goals  be able to do normal activities again without pain    Currently in Pain?  No/denies                       [facility] Adult PT Treatment/Exercise - 08/12/19 0001      Shoulder Exercises: Standing   Extension  Strengthening;Both;20 reps;Theraband    Theraband Level (Shoulder Extension)  Level 4 (Blue)   keeping wrists straight   Row  Strengthening;Both;20 reps;Theraband    Theraband Level (Shoulder Row)  Level 4 (Blue)   wrists straight   Other Standing Exercises  UBE L1 4 fwd/2back      Wrist Exercises   Other wrist exercises  forearm pronation/supination - 4lb educated and performed 3x10    Other wrist exercises  wrist flex and ext with eccentric lowering - 30x each 4lb      Ultrasound   Ultrasound  Location  wrist ext    Ultrasound Parameters  1.2, 20%,    Ultrasound Goals  Pain      Iontophoresis   Type of Iontophoresis  Dexamethasone    Location  distal to lateral epicondyle    Dose  1.0 mL #5    Time  4-6 hour release      Manual Therapy   Soft tissue mobilization  extensors and supinator Rt side               PT Short Term Goals - 07/22/19 0925      PT SHORT TERM GOAL #1   Title  ind with initial HEP    Status  Achieved      PT SHORT TERM GOAL #2   Title  pt will report he is able to work with 30% less pain during typical work day    Baseline  improving, no number given    Status  On-going        PT Long Term Goals - 07/05/19 1745      PT LONG TERM GOAL #1   Title  pt will report he is able to perform  full duties at work with 75% less pain    Time  8    Period  Weeks    Status  New    Target Date  08/26/19      PT LONG TERM GOAL #2   Title  pt will have 5/5 MMT of Rt supination for ability to do his work related tasks    Time  8    Period  Weeks    Status  New    Target Date  08/26/19      PT LONG TERM GOAL #3   Title  Pt will be ind with advanced HEP and able to return to bowling without increased pain    Time  8    Period  Weeks    Status  New    Target Date  08/26/19      PT LONG TERM GOAL #4   Title  Pt will report he is able to get back to lifting moderate level of weight without pain so he can build up to previous strength    Time  8    Period  Weeks    Status  New    Target Date  08/26/19            Plan - 08/12/19 0929    Clinical Impression Statement  Pt needed cues to keep wrists straight.  Exercises progressed with resistance and added bands to HEP.  Pt continue to feel better slowly.  He will benefit from skilled PT to progress strength.    PT Treatment/Interventions  ADLs/Self Care Home Management;Biofeedback;Cryotherapy;Electrical Stimulation;Iontophoresis 4mg /ml Dexamethasone;Moist Heat;Ultrasound;Therapeutic  exercise;Therapeutic activities;Neuromuscular re-education;Patient/family education;Manual techniques;Passive range of motion;Dry needling;Taping    PT Next Visit Plan  progress wrist strength, forearm strength as tolerated    PT Home Exercise Plan  Access Code: [email or web address]    Consulted and Agree with Plan of Care  Patient       Patient will benefit from skilled therapeutic intervention in order to improve the following deficits and impairments:  Pain, Impaired flexibility, Decreased strength, Increased muscle spasms  Visit Diagnosis: Other muscle spasm  Muscle weakness (generalized)     Problem List Patient Active Problem List   Diagnosis Date Noted  . Anemia 06/03/2016  . Schizophrenia ([place]) 04/24/2014  . Hyperlipidemia 04/24/2014  . Family history of prostate cancer 03/21/2014    Jule Ser, PT 08/12/2019, 9:32 AM  [facility] [address] [place], [place], [address] Phone: [phone]   Fax:  [phone]  Name: Justin Sanchez MRN: [id number] Date of Birth: 1975-12-08

## 2019-08-19 ENCOUNTER — Ambulatory Visit: Payer: Medicare HMO | Attending: Adult Health | Admitting: Physical Therapy

## 2019-08-19 ENCOUNTER — Other Ambulatory Visit: Payer: Self-pay

## 2019-08-19 DIAGNOSIS — M62838 Other muscle spasm: Secondary | ICD-10-CM | POA: Diagnosis present

## 2019-08-19 DIAGNOSIS — M6281 Muscle weakness (generalized): Secondary | ICD-10-CM | POA: Diagnosis present

## 2019-08-19 NOTE — Therapy (Signed)
[facility] [address] [place], [place], [address] Phone: [phone]   Fax:  [phone]  Physical Therapy Treatment  Patient Details  Name: Justin Sanchez MRN: [id number] Date of Birth: Jul 08, 1975 Referring Provider (PT): [name], NP   Encounter Date: 08/19/2019  PT End of Session - 08/19/19 0855    Visit Number  8    Date for PT Re-Evaluation  08/26/19    PT Start Time  0848    PT Stop Time  0925    PT Time Calculation (min)  37 min    Activity Tolerance  Patient tolerated treatment well    Behavior During Therapy  [facility] for tasks assessed/performed       Past Medical History:  Diagnosis Date  . Anemia    Iron deficinecy - taking iron  . Chicken pox   . Depression    Long time ago - denies currently for 10 years  . Drug abuse (HCC)   . Hyperlipidemia   . Schizophrenia (HCC)    2002 - well controlled    No past surgical history on file.  There were no vitals filed for this visit.  Subjective Assessment - 08/19/19 0857    Subjective  Pt states it is alittle better but still not using Rt arm at work.    Currently in Pain?  Yes    Pain Score  3     Pain Location  Arm    Pain Orientation  Right;Mid    Pain Descriptors / Indicators  Sore    Pain Type  Acute pain    Pain Onset  More than a month ago    Pain Frequency  Intermittent    Multiple Pain Sites  No                       [facility] Adult PT Treatment/Exercise - 08/19/19 0001      Shoulder Exercises: Standing   Extension  Strengthening;Both;20 reps;Weights    Extension Weight (lbs)  20    Row  Strengthening;Both;20 reps;Weights    Row Weight (lbs)  25    Other Standing Exercises  UBE L2 4 fwd/2back      Wrist Exercises   Other wrist exercises  forearm pronation/supination - 5lb educated and performed 3x10    Other wrist exercises  wrist flex and ext with eccentric lowering - 30x each 4lb      Ultrasound   Ultrasound  Location  wrist ext    Ultrasound Parameters  1.2, 20%,    Ultrasound Goals  Pain      Iontophoresis   Type of Iontophoresis  Dexamethasone    Location  distal to lateral epicondyle    Dose  1.0 mL #5               PT Short Term Goals - 08/19/19 0856      PT SHORT TERM GOAL #1   Title  ind with initial HEP    Status  Achieved      PT SHORT TERM GOAL #2   Title  pt will report he is able to work with 30% less pain during typical work day    Baseline  60%    Status  Achieved        PT Long Term Goals - 08/19/19 8828      PT LONG TERM GOAL #1   Title  pt will report he is able to perform  full duties at work with 75% less pain    Baseline  still not using the Rt hand; pt feels 60% improved    Status  On-going      PT LONG TERM GOAL #2   Title  pt will have 5/5 MMT of Rt supination for ability to do his work related tasks    Baseline  hurts less    Status  On-going      PT LONG TERM GOAL #3   Title  Pt will be ind with advanced HEP and able to return to bowling without increased pain    Baseline  not planning on bowling again    Status  On-going      PT LONG TERM GOAL #4   Title  Pt will report he is able to get back to lifting moderate level of weight without pain so he can build up to previous strength    Baseline  not doing weight lifting    Status  On-going            Plan - 08/19/19 0926    Clinical Impression Statement  Pt was able to increase resistance.  He has 5/5 pain free Rt wrist strength except for supination which is 4+ with pain.  Pt will continue to work on strength and likely discharge next session with HEP.    PT Treatment/Interventions  ADLs/Self Care Home Management;Biofeedback;Cryotherapy;Electrical Stimulation;Iontophoresis 4mg /ml Dexamethasone;Moist Heat;Ultrasound;Therapeutic exercise;Therapeutic activities;Neuromuscular re-education;Patient/family education;Manual techniques;Passive range of motion;Dry needling;Taping    PT  Next Visit Plan  progress wrist strength, forearm strength as tolerated    PT Home Exercise Plan  Access Code: [email or web address]    Consulted and Agree with Plan of Care  Patient       Patient will benefit from skilled therapeutic intervention in order to improve the following deficits and impairments:  Pain, Impaired flexibility, Decreased strength, Increased muscle spasms  Visit Diagnosis: Other muscle spasm  Muscle weakness (generalized)     Problem List Patient Active Problem List   Diagnosis Date Noted  . Anemia 06/03/2016  . Schizophrenia ([place]) 04/24/2014  . Hyperlipidemia 04/24/2014  . Family history of prostate cancer 03/21/2014    Jule Ser, PT 08/19/2019, 9:29 AM  [facility] [address], [address] [place], [place], [address] Phone: [phone]   Fax:  [phone]  Name: Justin Sanchez MRN: [id number] Date of Birth: 01/30/1976

## 2019-08-22 ENCOUNTER — Encounter: Payer: Self-pay | Admitting: Adult Health

## 2019-08-23 ENCOUNTER — Other Ambulatory Visit: Payer: Self-pay | Admitting: Adult Health

## 2019-08-23 DIAGNOSIS — M25521 Pain in right elbow: Secondary | ICD-10-CM

## 2019-08-26 ENCOUNTER — Ambulatory Visit: Payer: Medicare HMO | Admitting: Physical Therapy

## 2019-08-26 ENCOUNTER — Encounter: Payer: Self-pay | Admitting: Physical Therapy

## 2019-08-26 ENCOUNTER — Other Ambulatory Visit: Payer: Self-pay

## 2019-08-26 DIAGNOSIS — M62838 Other muscle spasm: Secondary | ICD-10-CM | POA: Diagnosis not present

## 2019-08-26 DIAGNOSIS — M6281 Muscle weakness (generalized): Secondary | ICD-10-CM

## 2019-08-26 NOTE — Therapy (Addendum)
[facility] [address], [place], [place], [address] Phone: [phone]   Fax:  [phone]  Physical Therapy Treatment  Patient Details  Name: Traye Bates MRN: [id number] Date of Birth: [birth year] Referring Provider (PT): Dorothyann Peng, NP   Encounter Date: 08/26/2019  PT End of Session - 08/26/19 0858    Visit Number  9    Date for PT Re-Evaluation  08/26/19    PT Start Time  0932   arrived late   PT Stop Time  0930    PT Time Calculation (min)  37 min    Activity Tolerance  Patient tolerated treatment well    Behavior During Therapy  [facility] for tasks assessed/performed       Past Medical History:  Diagnosis Date  . Anemia    Iron deficinecy - taking iron  . Chicken pox   . Depression    Long time ago - denies currently for 10 years  . Drug abuse ([place])   . Hyperlipidemia   . Schizophrenia ([place])    2002 - well controlled    History reviewed. No pertinent surgical history.  There were no vitals filed for this visit.  Subjective Assessment - 08/26/19 0855    Subjective  Work has been busier.  I have been using the Rt arm more at work and it isn't getting as irritated.    Patient Stated Goals  be able to do normal activities again without pain    Currently in Pain?  No/denies                       [facility] Adult PT Treatment/Exercise - 08/26/19 0001      Shoulder Exercises: Standing   Extension  Strengthening;Both;20 reps;Weights    Extension Weight (lbs)  20    Row  Strengthening;Both;20 reps;Weights    Row Weight (lbs)  25    Other Standing Exercises  UBE L2 4 fwd/2back      Wrist Exercises   Other wrist exercises  forearm pronation/supination - 5lb educated and performed 3x10    Other wrist exercises  wrist flex and ext with eccentric lowering - 30x each 5lb      Ultrasound   Ultrasound Location  wrist ext    Ultrasound Parameters  1.2, 20%, 1MHz    Ultrasound Goals  Pain       Iontophoresis   Type of Iontophoresis  Dexamethasone    Location  distal to lateral epicondyle    Dose  1.0 mL #6    Time  4-6 hour release               PT Short Term Goals - 08/19/19 0856      PT SHORT TERM GOAL #1   Title  ind with initial HEP    Status  Achieved      PT SHORT TERM GOAL #2   Title  pt will report he is able to work with 30% less pain during typical work day    Baseline  60%    Status  Achieved        PT Long Term Goals - 08/19/19 6712      PT LONG TERM GOAL #1   Title  pt will report he is able to perform full duties at work with 75% less pain    Baseline  still not using the Rt hand; pt feels 60% improved    Status  On-going  PT LONG TERM GOAL #2   Title  pt will have 5/5 MMT of Rt supination for ability to do his work related tasks    Baseline  hurts less    Status  On-going      PT LONG TERM GOAL #3   Title  Pt will be ind with advanced HEP and able to return to bowling without increased pain    Baseline  not planning on bowling again    Status  On-going      PT LONG TERM GOAL #4   Title  Pt will report he is able to get back to lifting moderate level of weight without pain so he can build up to previous strength    Baseline  not doing weight lifting    Status  On-going            Plan - 08/26/19 0903    Clinical Impression Statement  Pt states he is still doing better and feeling 65% better.  Still working on [employer] and is independent with this.  He will discharge today    PT Treatment/Interventions  ADLs/Self Care Home Management;Biofeedback;Cryotherapy;Electrical Stimulation;Iontophoresis 26m/ml Dexamethasone;Moist Heat;Ultrasound;Therapeutic exercise;Therapeutic activities;Neuromuscular re-education;Patient/family education;Manual techniques;Passive range of motion;Dry needling;Taping    PT Next Visit Plan  d/c today    PT Home Exercise Plan  Access Code: [email or web address]    Consulted and Agree with Plan of Care  Patient        Patient will benefit from skilled therapeutic intervention in order to improve the following deficits and impairments:  Pain, Impaired flexibility, Decreased strength, Increased muscle spasms  Visit Diagnosis: Other muscle spasm  Muscle weakness (generalized)     Problem List Patient Active Problem List   Diagnosis Date Noted  . Anemia 06/03/2016  . [place] 04/24/[place]  . Hyperlipidemia 04/24/2014  . Family history of prostate cancer 03/21/2014    JJule Ser PT 08/26/2019, 9:29 AM  [facility] [address] [place] [address]: [phone]  Fax:  [phone] Name: SConcepcion KirkpatrickMRN: [id number] of Birth: [birth year] PHYSICAL THERAPY DISCHARGE SUMMARY  Visits from Start of Care: 9  Current functional level related to goals / functional outcomes: See above   Remaining deficits: See above   Education / Equipment: HEP  Plan: Patient agrees to discharge.  Patient goals were not met. Patient is being discharged due to not returning since the last visit.  ?????    [employer] PT 12/21/19 1:10 PM

## 2019-08-27 ENCOUNTER — Encounter: Payer: Self-pay | Admitting: Adult Health

## 2019-08-30 ENCOUNTER — Other Ambulatory Visit: Payer: Self-pay | Admitting: Adult Health

## 2019-08-30 DIAGNOSIS — M25521 Pain in right elbow: Secondary | ICD-10-CM

## 2019-08-31 ENCOUNTER — Encounter: Payer: Self-pay | Admitting: Adult Health

## 2019-08-31 ENCOUNTER — Other Ambulatory Visit: Payer: Self-pay | Admitting: Adult Health

## 2019-08-31 NOTE — Telephone Encounter (Signed)
30 DAY SUPPLY SENT TO THE PHARMACY BY E-SCRIBE.  DUE FOR CPX. 

## 2019-09-13 ENCOUNTER — Other Ambulatory Visit: Payer: Self-pay | Admitting: Adult Health

## 2019-09-14 NOTE — Telephone Encounter (Signed)
Were you prescribing this for a short term while the pt found another provider?

## 2019-09-27 ENCOUNTER — Other Ambulatory Visit: Payer: Medicare HMO

## 2019-09-27 ENCOUNTER — Inpatient Hospital Stay: Admission: RE | Admit: 2019-09-27 | Payer: Medicare HMO | Source: Ambulatory Visit

## 2019-10-13 ENCOUNTER — Ambulatory Visit: Payer: Medicare HMO | Admitting: Adult Health

## 2019-10-13 ENCOUNTER — Other Ambulatory Visit: Payer: Self-pay

## 2019-10-14 ENCOUNTER — Ambulatory Visit (INDEPENDENT_AMBULATORY_CARE_PROVIDER_SITE_OTHER): Payer: Medicare HMO | Admitting: Adult Health

## 2019-10-14 ENCOUNTER — Encounter: Payer: Self-pay | Admitting: Adult Health

## 2019-10-14 VITALS — BP 124/82 | Temp 98.3°F | Wt 180.0 lb

## 2019-10-14 DIAGNOSIS — M25521 Pain in right elbow: Secondary | ICD-10-CM

## 2019-10-15 NOTE — Progress Notes (Signed)
Subjective:    Patient ID: Justin Sanchez, male    DOB: 03-11-1976, 44 y.o.   MRN: [id number]  HPI 44 year old male who  has a past medical history of Anemia, Chicken pox, Depression, Drug abuse (HCC), Hyperlipidemia, and Schizophrenia (HCC).  Follow up to right upper arm pain that has been present since November. First thought to be muscular pain. Started soon after bowling. 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    Review of Systems See HPI   Past Medical History:  Diagnosis Date  . Anemia    Iron deficinecy - taking iron  . Chicken pox   . Depression    Long time ago - denies currently for 10 years  . Drug abuse (HCC)   . Hyperlipidemia   . Schizophrenia (HCC)    2002 - well controlled    Social History   Socioeconomic History  . Marital status: Divorced    Spouse name: [name]  . Number of children: 1  . Years of education: 98  . Highest education level: Not on file  Occupational History  . Occupation: Other    Comment: Works Lobbyist  Tobacco Use  . Smoking status: Former Smoker    Packs/day: 1.50    Years: 6.00    Pack years: 9.00  . Smokeless tobacco: Never Used  Substance and Sexual Activity  . Alcohol use: Yes    Alcohol/week: 10.0 standard drinks    Types: 10 Cans of beer per week  . Drug use: No  . Sexual activity: Yes    Birth control/protection: Condom  Other Topics Concern  . Not on file  Social History Narrative   Born in [place], [place]   Raised [place], [place]   Completed some college   Family brought to [facility]      Cycle and exercise, read,    Diet: eats a little bit of everything, stays away from fried food - eats lots of vegetables.    Social Determinants of Health   Financial Resource Strain:   .  Difficulty of Paying Living Expenses:   Food Insecurity:   . Worried About Programme researcher, broadcasting/film/video in the Last Year:   . Barista in the Last Year:   Transportation Needs:   . Freight forwarder (Medical):   Marland Kitchen Lack of Transportation (Non-Medical):   Physical Activity:   . Days of Exercise per Week:   . Minutes of Exercise per Session:   Stress:   . Feeling of Stress :   Social Connections:   . Frequency of Communication with Friends and Family:   . Frequency of Social Gatherings with Friends and Family:   . Attends Religious Services:   . Active Member of Clubs or Organizations:   . Attends Banker Meetings:   Marland Kitchen Marital Status:   Intimate Partner Violence:   . Fear of Current or Ex-Partner:   . Emotionally Abused:   Marland Kitchen Physically Abused:   . Sexually Abused:     History reviewed. No pertinent surgical history.  Family History  Problem Relation Age of Onset  . Prostate cancer Maternal Uncle     No Known Allergies  Current Outpatient Medications on File Prior to Visit  Medication Sig  Dispense Refill  . atorvastatin (LIPITOR) 40 MG tablet TAKE 1 TABLET BY MOUTH EVERY DAY 90 tablet 1  . Flaxseed Oil OIL by Does not apply route.    Marland Kitchen OLANZapine (ZYPREXA) 5 MG tablet TAKE 1 TABLET BY MOUTH DAILY. **DUE FOR YEARLY PHYSICAL** 30 tablet 0  . UNABLE TO FIND CBD: USES IN PLACE OF NSAIDS    . vitamin B-12 (CYANOCOBALAMIN) 500 MCG tablet Take 500 mcg by mouth daily.    Marland Kitchen VITAMIN D PO Take 500 Units by mouth daily.      No current facility-administered medications on file prior to visit.    BP 124/82   Temp 98.3 F (36.8 C)   Wt 180 lb (81.6 kg)   BMI 28.19 kg/m       Objective:   Physical Exam Vitals and nursing note reviewed.  Constitutional:      Appearance: Normal appearance.  Musculoskeletal:        General: Tenderness (along left lateral ulnar ligament ) present. Normal range of motion.  Skin:    General: Skin is warm and dry.     Capillary  Refill: Capillary refill takes less than 2 seconds.  Neurological:     General: No focal deficit present.     Mental Status: He is alert. Mental status is at baseline. He is disoriented.       Assessment & Plan:  1. Right elbow pain - Believe this is more ligament/tendon injury. Will refer to sports medicine since this is not improving as suspected  - Ambulatory referral to Sports Medicine   Dorothyann Peng, NP

## 2019-10-17 ENCOUNTER — Encounter: Payer: Self-pay | Admitting: Adult Health

## 2019-10-18 ENCOUNTER — Encounter: Payer: Self-pay | Admitting: Adult Health

## 2019-10-28 ENCOUNTER — Ambulatory Visit (INDEPENDENT_AMBULATORY_CARE_PROVIDER_SITE_OTHER): Payer: Medicare HMO | Admitting: Family Medicine

## 2019-10-28 ENCOUNTER — Ambulatory Visit: Payer: Self-pay

## 2019-10-28 ENCOUNTER — Encounter: Payer: Self-pay | Admitting: Family Medicine

## 2019-10-28 ENCOUNTER — Other Ambulatory Visit: Payer: Self-pay

## 2019-10-28 VITALS — BP 114/72 | HR 82 | Ht 67.0 in | Wt 168.0 lb

## 2019-10-28 DIAGNOSIS — M7711 Lateral epicondylitis, right elbow: Secondary | ICD-10-CM

## 2019-10-28 DIAGNOSIS — G5631 Lesion of radial nerve, right upper limb: Secondary | ICD-10-CM | POA: Diagnosis not present

## 2019-10-28 DIAGNOSIS — M25521 Pain in right elbow: Secondary | ICD-10-CM | POA: Diagnosis not present

## 2019-10-28 MED ORDER — NITROGLYCERIN 0.2 MG/HR TD PT24
MEDICATED_PATCH | TRANSDERMAL | 0 refills | Status: DC
Start: 2019-10-28 — End: 2019-11-21

## 2019-10-28 NOTE — Patient Instructions (Signed)
Exercises 3x a week Nitroglycerin Protocol   Apply 1/4 nitroglycerin patch to affected area daily.  Change position of patch within the affected area every 24 hours.  You may experience a headache during the first 1-2 weeks of using the patch, these should subside.  If you experience headaches after beginning nitroglycerin patch treatment, you may take your preferred over the counter pain reliever.  Another side effect of the nitroglycerin patch is skin irritation or rash related to patch adhesive.  Please notify our office if you develop more severe headaches or rash, and stop the patch.  Tendon healing with nitroglycerin patch may require 12 to 24 weeks depending on the extent of injury.  Men should not use if taking Viagra, Cialis, or Levitra.   Do not use if you have migraines or rosacea. Brace at night See me in 6 weeks

## 2019-10-28 NOTE — Assessment & Plan Note (Signed)
Elbow anatomy was reviewed, and tendinopathy was explained.  Pt. given a home rehab program. Start with isometrics and ROM, then a series of concentric and eccentric exercises should be done starting with no weight, work up to 1 lb, hammer, etc.  Use counterforce strap if working or using hands.  Formal PT would be beneficial. Emphasized stretching an cross-friction massage Emphasized proper palms up lifting biomechanics to unload ECRB  

## 2019-10-28 NOTE — Assessment & Plan Note (Signed)
Patient has been treated initially for more of a lateral epicondylitis and seems to be resolving somewhat.  Discussed with patient about icing regimen, home exercises, proper lifting mechanics.  Patient though did have more of an entrapment noted on ultrasound today and given injection as well.  Discussed gabapentin which patient declined.  Patient did elect to try the nitroglycerin no.  Discussed wearing a wrist brace at night and proper lifting mechanics.  Follow-up again in 4 to 8 weeks

## 2019-10-28 NOTE — Progress Notes (Signed)
[place] Phone: [phone] Subjective:   Justin Sanchez, am serving as a scribe for Dr. Hulan Saas. This visit occurred during the SARS-CoV-2 public health emergency.  Safety protocols were in place, including screening questions prior to the visit, additional usage of staff PPE, and extensive cleaning of exam room while observing appropriate contact time as indicated for disinfecting solutions.   I'm seeing this patient by the request  of:  Nafziger, Tommi Rumps, NP  CC: Right elbow pain  [id number]  Justin Sanchez is a 44 y.o. male coming in with complaint of right elbow pain over extensor tendon bundle. 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 Is doing physical therapy which did help. Pain with gripping now over lateral epicondyle. Continues to do therapy at home. Uses CBD for pain.      Past Medical History:  Diagnosis Date  . Anemia    Iron deficinecy - taking iron  . Chicken pox   . Depression    Long time ago - denies currently for 10 years  . Drug abuse ([place])   . Hyperlipidemia   . Schizophrenia ([place])    2002 - well controlled   Sanchez past surgical history on file. Social History   Socioeconomic History  . Marital status: Divorced    Spouse name: [name]  . Number of children: 1  . Years of education: 61  . Highest education level: Not on file  Occupational History  . Occupation: Other    Comment: Works International aid/development worker  Tobacco Use  . Smoking status: Former Smoker    Packs/day: 1.50    Years: 6.00    Pack years: 9.00  . Smokeless tobacco: Never Used  Substance and Sexual Activity  . Alcohol use: Yes    Alcohol/week: 10.0 standard drinks    Types: 10 Cans of beer per week  . Drug use: Sanchez  . Sexual activity: Yes    Birth control/protection: Condom  Other Topics Concern  . Not on file  Social History Narrative   Born in [place], [place]   Raised [place]   Completed some college   Family brought to [facility]      Cycle and exercise, read,    Diet: eats a little bit of everything, stays away from fried food - eats lots of vegetables.    Social Determinants of Health   Financial Resource Strain:   . Difficulty of Paying Living Expenses:   Food Insecurity:   . Worried About Charity fundraiser in the Last Year:   . Arboriculturist in the Last Year:   Transportation Needs:   . Film/video editor (Medical):   Marland Kitchen Lack of Transportation (Non-Medical):   Physical Activity:   . Days of Exercise per Week:   . Minutes of Exercise per Session:   Stress:   . Feeling of Stress :   Social Connections:   . Frequency of Communication with Friends and Family:   . Frequency of Social Gatherings with Friends and Family:   . Attends Religious Services:   . Active Member of Clubs or Organizations:   . Attends Archivist Meetings:   Marland Kitchen Marital Status:    Sanchez Known Allergies Family History  Problem Relation Age of Onset  . Prostate cancer Maternal Uncle      Current Outpatient Medications (Cardiovascular):  .  atorvastatin (LIPITOR) 40 MG tablet, TAKE 1 TABLET BY  MOUTH EVERY DAY .  nitroGLYCERIN (NITRO-DUR) 0.2 mg/hr patch, Apply 1/4 of a patch to skin once daily.    Current Outpatient Medications (Hematological):  .  vitamin B-12 (CYANOCOBALAMIN) 500 MCG tablet, Take 500 mcg by mouth daily.  Current Outpatient Medications (Other):  Marland Kitchen  OLANZapine (ZYPREXA) 5 MG tablet, TAKE 1 TABLET BY MOUTH DAILY. **DUE FOR YEARLY PHYSICAL** .  UNABLE TO FIND, CBD: USES IN PLACE OF NSAIDS .  VITAMIN D PO, Take 500 Units by mouth daily.    Reviewed prior external information including notes and imaging from  primary care provider As well as notes that were available from care everywhere and other healthcare systems.  Past medical history, social, surgical and family history all reviewed in electronic medical record.  Sanchez pertanent  information unless stated regarding to the chief complaint.   Review of Systems:  Sanchez headache, visual changes, nausea, vomiting, diarrhea, constipation, dizziness, abdominal pain, skin rash, fevers, chills, night sweats, weight loss, swollen lymph nodes, body aches, joint swelling, chest pain, shortness of breath, mood changes. POSITIVE muscle aches  Objective  Blood pressure 114/72, pulse 82, height 5\' 7"  (1.702 m), weight 168 lb (76.2 kg), SpO2 97 %.   General: Sanchez apparent distress alert and oriented x3 mood and affect normal, dressed appropriately.  HEENT: Pupils equal, extraocular movements intact  Respiratory: Patient's speak in full sentences and does not appear short of breath  Cardiovascular: Sanchez lower extremity edema, non tender, Sanchez erythema  Neuro: Cranial nerves II through XII are intact, neurovascularly intact in all extremities with 2+ DTRs and 2+ pulses.  Gait normal with good balance and coordination.  MSK:  Non tender with full range of motion and good stability and symmetric strength and tone of shoulders,  wrist, hip, knee and ankles bilaterally.  Right elbow shows some tenderness over the lateral epicondylar area patient does have worsening pain with resisted wrist extension.  Patient does have significant amount of pain also in the radial nerve area in the muscle belly of the common extensor  Limited musculoskeletal ultrasound was performed and interpreted by  Limited ultrasound shows the patient does have mild scar tissue formation noted patient known does have some mild increase in neovascularization in this area.  Significant dilation of the radial nerve in the mid substance of the common extensor muscle.  Procedure: Real-time Ultrasound Guided Injection of right radial nerve sheath Device: GE Logiq Q7 Ultrasound guided injection is preferred based studies that show increased duration, increased effect, greater accuracy, decreased procedural pain, increased  response rate, and decreased cost with ultrasound guided versus blind injection.  Verbal informed consent obtained.  Time-out conducted.  Noted Sanchez overlying erythema, induration, or other signs of local infection.  Skin prepped in a sterile fashion.  Local anesthesia: Topical Ethyl chloride.  With sterile technique and under real time ultrasound guidance: With a 25-gauge half inch needle injected in with 1 cc of 0.5% Marcaine and 0.5 cc of Kenalog 40 mg/mL. Completed without difficulty  Pain immediately resolved suggesting accurate placement of the medication.  Advised to call if fevers/chills, erythema, induration, drainage, or persistent bleeding.  Images permanently stored and available for review in the ultrasound unit.  Impression: Technically successful ultrasound guided injection.   Impression and Recommendations:     This case required medical decision making of moderate complexity. The above documentation has been reviewed and is accurate and complete Judi Saa, DO       Note: This dictation was  prepared with Dragon dictation along with smaller phrase technology. Any transcriptional errors that result from this process are unintentional.

## 2019-10-31 ENCOUNTER — Other Ambulatory Visit: Payer: Self-pay | Admitting: Adult Health

## 2019-11-02 ENCOUNTER — Encounter: Payer: Self-pay | Admitting: Adult Health

## 2019-11-02 NOTE — Telephone Encounter (Signed)
DENIED.  PT IS PAST DUE FOR CPX. 

## 2019-11-11 DIAGNOSIS — H52223 Regular astigmatism, bilateral: Secondary | ICD-10-CM | POA: Diagnosis not present

## 2019-11-11 DIAGNOSIS — H524 Presbyopia: Secondary | ICD-10-CM | POA: Diagnosis not present

## 2019-11-20 ENCOUNTER — Other Ambulatory Visit: Payer: Self-pay | Admitting: Family Medicine

## 2019-12-01 ENCOUNTER — Other Ambulatory Visit: Payer: Self-pay

## 2019-12-01 ENCOUNTER — Ambulatory Visit (INDEPENDENT_AMBULATORY_CARE_PROVIDER_SITE_OTHER): Payer: Medicare HMO | Admitting: Adult Health

## 2019-12-01 ENCOUNTER — Encounter: Payer: Self-pay | Admitting: Adult Health

## 2019-12-01 ENCOUNTER — Other Ambulatory Visit: Payer: Self-pay | Admitting: Family Medicine

## 2019-12-01 VITALS — BP 110/80 | Temp 98.0°F | Wt 177.0 lb

## 2019-12-01 DIAGNOSIS — M25542 Pain in joints of left hand: Secondary | ICD-10-CM

## 2019-12-01 NOTE — Progress Notes (Signed)
Subjective:    Patient ID: Justin Sanchez, male    DOB: 1975/09/26, 44 y.o.   MRN: [id number]  HPI 44 year old male who  has a past medical history of Anemia, Chicken pox, Depression, Drug abuse ([place]), Hyperlipidemia, and Schizophrenia ([place]).   He presents to the office today for an acute issue of abnormal feeling in his left pinky finger.  Not pain or discomfort but states "something feels off".  Reports that he has dislocated this finger in the past but was able to "snap it back in".  He denies any loss of range of motion, swelling, redness, or warmth.  Has not noticed snapping or popping sensation   Review of Systems See HPI   Past Medical History:  Diagnosis Date  . Anemia    Iron deficinecy - taking iron  . Chicken pox   . Depression    Long time ago - denies currently for 10 years  . Drug abuse ([place])   . Hyperlipidemia   . Schizophrenia ([place])    2002 - well controlled    Social History   Socioeconomic History  . Marital status: Divorced    Spouse name: [name]  . Number of children: 1  . Years of education: 57  . Highest education level: Not on file  Occupational History  . Occupation: Other    Comment: Works International aid/development worker  Tobacco Use  . Smoking status: Former Smoker    Packs/day: 1.50    Years: 6.00    Pack years: 9.00  . Smokeless tobacco: Never Used  Vaping Use  . Vaping Use: Never used  Substance and Sexual Activity  . Alcohol use: Yes    Alcohol/week: 10.0 standard drinks    Types: 10 Cans of beer per week  . Drug use: No  . Sexual activity: Yes    Birth control/protection: Condom  Other Topics Concern  . Not on file  Social History Narrative   Born in [place], [place]   Raised [employer]   Completed some college   Family brought to [facility]      Cycle and exercise, read,    Diet: eats a little bit of everything, stays away from fried food - eats lots of vegetables.    Social Determinants of Health   Financial Resource  Strain:   . Difficulty of Paying Living Expenses:   Food Insecurity:   . Worried About Charity fundraiser in the Last Year:   . Arboriculturist in the Last Year:   Transportation Needs:   . Film/video editor (Medical):   Marland Kitchen Lack of Transportation (Non-Medical):   Physical Activity:   . Days of Exercise per Week:   . Minutes of Exercise per Session:   Stress:   . Feeling of Stress :   Social Connections:   . Frequency of Communication with Friends and Family:   . Frequency of Social Gatherings with Friends and Family:   . Attends Religious Services:   . Active Member of Clubs or Organizations:   . Attends Archivist Meetings:   Marland Kitchen Marital Status:   Intimate Partner Violence:   . Fear of Current or Ex-Partner:   . Emotionally Abused:   Marland Kitchen Physically Abused:   . Sexually Abused:     History reviewed. No pertinent surgical history.  Family History  Problem Relation Age of Onset  . Prostate cancer Maternal Uncle     No Known Allergies  Current Outpatient  Medications on File Prior to Visit  Medication Sig Dispense Refill  . atorvastatin (LIPITOR) 40 MG tablet TAKE 1 TABLET BY MOUTH EVERY DAY 90 tablet 1  . nitroGLYCERIN (NITRODUR - DOSED IN MG/24 HR) 0.2 mg/hr patch APPLY 1/4 OF PATCH TO SKIN DAILY 30 patch 0  . OLANZapine (ZYPREXA) 5 MG tablet TAKE 1 TABLET BY MOUTH DAILY. **DUE FOR YEARLY PHYSICAL** 30 tablet 0  . UNABLE TO FIND CBD: USES IN PLACE OF NSAIDS    . vitamin B-12 (CYANOCOBALAMIN) 500 MCG tablet Take 500 mcg by mouth daily.    Marland Kitchen VITAMIN D PO Take 500 Units by mouth daily.      No current facility-administered medications on file prior to visit.    BP 110/80   Temp 98 F (36.7 C)   Wt 177 lb (80.3 kg)   BMI 27.72 kg/m       Objective:   Physical Exam Vitals and nursing note reviewed.  Constitutional:      Appearance: Normal appearance.  Musculoskeletal:        General: Swelling and tenderness present. Normal range of motion.      Comments: He has some tenderness with palpation to the medial aspect of the DIP joint.  Absolutely no loss of range of motion, swelling, deformities, or bruising noted  Skin:    General: Skin is warm and dry.  Neurological:     Mental Status: He is alert.  Psychiatric:        Mood and Affect: Mood normal.        Behavior: Behavior normal.        Thought Content: Thought content normal.        Judgment: Judgment normal.       Assessment & Plan:  1. Pain involving joint of finger of left hand -No concern for trigger finger, dislocation, or fracture.  Possible soft tissue strain.  I do not see a need to x-ray his hand today.  Advised conservative measures.  He does have nitroglycerin patches that he has been using on his right forearm.  He can try a small section of the nitro patch on his left finger to see if this helps with his discomfort Follow up PRN   [name], NP

## 2019-12-09 ENCOUNTER — Ambulatory Visit: Payer: Medicare HMO | Admitting: Family Medicine

## 2019-12-09 NOTE — Progress Notes (Signed)
No show

## 2019-12-11 ENCOUNTER — Encounter: Payer: Self-pay | Admitting: Family Medicine

## 2019-12-17 ENCOUNTER — Other Ambulatory Visit: Payer: Self-pay | Admitting: Adult Health

## 2019-12-20 ENCOUNTER — Other Ambulatory Visit: Payer: Self-pay

## 2019-12-20 ENCOUNTER — Encounter: Payer: Self-pay | Admitting: Family Medicine

## 2019-12-20 ENCOUNTER — Ambulatory Visit: Payer: Self-pay

## 2019-12-20 ENCOUNTER — Ambulatory Visit (INDEPENDENT_AMBULATORY_CARE_PROVIDER_SITE_OTHER): Payer: Medicare HMO | Admitting: Family Medicine

## 2019-12-20 VITALS — BP 130/80 | HR 67 | Ht 67.0 in | Wt 175.0 lb

## 2019-12-20 DIAGNOSIS — M79645 Pain in left finger(s): Secondary | ICD-10-CM | POA: Diagnosis not present

## 2019-12-20 DIAGNOSIS — M7711 Lateral epicondylitis, right elbow: Secondary | ICD-10-CM

## 2019-12-20 NOTE — Progress Notes (Signed)
[facility] [address] [place] [address] Phone: [phone] Subjective:   I Justin Sanchez am serving as a Neurosurgeon for Dr. Antoine Primas.  This visit occurred during the SARS-CoV-2 public health emergency.  Safety protocols were in place, including screening questions prior to the visit, additional usage of staff PPE, and extensive cleaning of exam room while observing appropriate contact time as indicated for disinfecting solutions.   I'm seeing this patient by the request  of:  [name], NP  CC: Right elbow pain and new finger pain left hand  [id number]   10/28/2019 Patient has been treated initially for more of a lateral epicondylitis and seems to be resolving somewhat.  Discussed with patient about icing regimen, home exercises, proper lifting mechanics.  Patient though did have more of an entrapment noted on ultrasound today and given injection as well.  Discussed gabapentin which patient declined.  Patient did elect to try the nitroglycerin no.  Discussed wearing a wrist brace at night and proper lifting mechanics.  Follow-up again in 4 to 8 weeks  12/20/2019 [name] is a 44 y.o. male coming in with complaint of right elbow pain. Patient states he is doing better. Believes he is at 100%. Patient states he injured his pinky at the DIP. Patient states he was picking something up when he felt his finger pop out of place. Patient states the finger doesn't feel right.   Onset- chronic  Patient denies any true pain with it.  Just sometimes feels like it is getting in the way.    Past Medical History:  Diagnosis Date  . Anemia    Iron deficinecy - taking iron  . Chicken pox   . Depression    Long time ago - denies currently for 10 years  . Drug abuse ([facility])   . Hyperlipidemia   . Schizophrenia ([facility])    2002 - well controlled   No past surgical history on file. Social History   Socioeconomic History  . Marital status: Divorced      Spouse name: [name]  . Number of children: 1  . Years of education: 29  . Highest education level: Not on file  Occupational History  . Occupation: Other    Comment: Works Lobbyist  Tobacco Use  . Smoking status: Former Smoker    Packs/day: 1.50    Years: 6.00    Pack years: 9.00  . Smokeless tobacco: Never Used  Vaping Use  . Vaping Use: Never used  Substance and Sexual Activity  . Alcohol use: Yes    Alcohol/week: 10.0 standard drinks    Types: 10 Cans of beer per week  . Drug use: No  . Sexual activity: Yes    Birth control/protection: Condom  Other Topics Concern  . Not on file  Social History Narrative   Born in [place], [place]   Raised [place], [place]   Completed some college   Family brought to [facility]      Cycle and exercise, read,    Diet: eats a little bit of everything, stays away from fried food - eats lots of vegetables.    Social Determinants of Health   Financial Resource Strain:   . Difficulty of Paying Living Expenses:   Food Insecurity:   . Worried About Programme researcher, broadcasting/film/video in the Last Year:   . Barista in the Last Year:   Transportation Needs:   . Lack of Transportation (  Medical):   Marland Sanchez Lack of Transportation (Non-Medical):   Physical Activity:   . Days of Exercise per Week:   . Minutes of Exercise per Session:   Stress:   . Feeling of Stress :   Social Connections:   . Frequency of Communication with Friends and Family:   . Frequency of Social Gatherings with Friends and Family:   . Attends Religious Services:   . Active Member of Clubs or Organizations:   . Attends Banker Meetings:   Marland Sanchez Marital Status:    No Known Allergies Family History  Problem Relation Age of Onset  . Prostate cancer Maternal Uncle      Current Outpatient Medications (Cardiovascular):  .  atorvastatin (LIPITOR) 40 MG tablet, TAKE 1 TABLET BY MOUTH EVERY DAY .  nitroGLYCERIN (NITRODUR - DOSED IN MG/24 HR) 0.2 mg/hr  patch, APPLY 1/4 OF PATCH TO SKIN DAILY    Current Outpatient Medications (Hematological):  .  vitamin B-12 (CYANOCOBALAMIN) 500 MCG tablet, Take 500 mcg by mouth daily.  Current Outpatient Medications (Other):  Marland Sanchez  OLANZapine (ZYPREXA) 5 MG tablet, TAKE 1 TABLET BY MOUTH DAILY. **DUE FOR YEARLY PHYSICAL** .  UNABLE TO FIND, CBD: USES IN PLACE OF NSAIDS .  VITAMIN D PO, Take 500 Units by mouth daily.    Reviewed prior external information including notes and imaging from  primary care provider As well as notes that were available from care everywhere and other healthcare systems.  Past medical history, social, surgical and family history all reviewed in electronic medical record.  No pertanent information unless stated regarding to the chief complaint.   Review of Systems:  No headache, visual changes, nausea, vomiting, diarrhea, constipation, dizziness, abdominal pain, skin rash, fevers, chills, night sweats, weight loss, swollen lymph nodes, body aches, joint swelling, chest pain, shortness of breath, mood changes. POSITIVE muscle aches  Objective  Blood pressure 130/80, pulse 67, height 5\' 7"  (1.702 m), weight 175 lb (79.4 kg), SpO2 98 %.   General: No apparent distress alert and oriented x3 mood and affect normal, dressed appropriately.  HEENT: Pupils equal, extraocular movements intact  Respiratory: Patient's speak in full sentences and does not appear short of breath  Cardiovascular: No lower extremity edema, non tender, no erythema  Neuro: Cranial nerves II through XII are intact, neurovascularly intact in all extremities with 2+ DTRs and 2+ pulses.  Gait normal with good balance and coordination.  MSK:  Right elbow shows the patient does have minimal tenderness with resisted wrist extension at the lateral epicondylar region.  No significant swelling.  Full range of motion is noted of the elbow at the time.  Patient's left hand distal IP joint does have full range of motion  but is minorly tender.  No effusion noted.  Limited musculoskeletal ultrasound was performed and interpreted by  Limited ultrasound of patient's right elbow shows lateral epicondylar area still has some very mild hypoechoic changes but no intrasubstance tearing of the common extensor tendon noted at this time.    Impression and Recommendations:     The above documentation has been reviewed and is accurate and complete Judi Saa, DO       Note: This dictation was prepared with Dragon dictation along with smaller phrase technology. Any transcriptional errors that result from this process are unintentional.

## 2019-12-20 NOTE — Telephone Encounter (Signed)
30 DAY SUPPLY SENT TO THE PHARMACY.  PT IS PAST DUE FOR CPX. 

## 2019-12-20 NOTE — Patient Instructions (Addendum)
Good to see you Nitro through July Spl[place] at night Exercise 3 times a week  See me again in 2 months

## 2019-12-20 NOTE — Assessment & Plan Note (Signed)
Doing significantly better at this time.  Patient will continue the nitroglycerin for 1 more month.  Discussed avoiding heavy lifting at the moment.  I believe patient will do extremely well with conservative therapy and only follow-up with me in 2 months if needed

## 2019-12-26 ENCOUNTER — Other Ambulatory Visit: Payer: Self-pay | Admitting: Adult Health

## 2019-12-27 NOTE — Telephone Encounter (Signed)
DENIED.  DUE FOR YEARLY PHYSICAL.

## 2020-01-04 ENCOUNTER — Encounter: Payer: Self-pay | Admitting: Adult Health

## 2020-01-12 ENCOUNTER — Ambulatory Visit (INDEPENDENT_AMBULATORY_CARE_PROVIDER_SITE_OTHER): Payer: Medicare HMO | Admitting: Adult Health

## 2020-01-12 ENCOUNTER — Encounter: Payer: Self-pay | Admitting: Adult Health

## 2020-01-12 ENCOUNTER — Other Ambulatory Visit: Payer: Self-pay

## 2020-01-12 VITALS — BP 106/76 | Temp 98.1°F | Ht 67.5 in | Wt 173.0 lb

## 2020-01-12 DIAGNOSIS — F200 Paranoid schizophrenia: Secondary | ICD-10-CM

## 2020-01-12 DIAGNOSIS — E559 Vitamin D deficiency, unspecified: Secondary | ICD-10-CM

## 2020-01-12 DIAGNOSIS — Z8042 Family history of malignant neoplasm of prostate: Secondary | ICD-10-CM

## 2020-01-12 DIAGNOSIS — R69 Illness, unspecified: Secondary | ICD-10-CM | POA: Diagnosis not present

## 2020-01-12 DIAGNOSIS — Z0000 Encounter for general adult medical examination without abnormal findings: Secondary | ICD-10-CM | POA: Diagnosis not present

## 2020-01-12 DIAGNOSIS — E782 Mixed hyperlipidemia: Secondary | ICD-10-CM | POA: Diagnosis not present

## 2020-01-12 MED ORDER — OLANZAPINE 5 MG PO TABS: ORAL_TABLET | ORAL | 1 refills | Status: DC

## 2020-01-12 NOTE — Progress Notes (Signed)
Subjective:    Patient ID: Justin Sanchez, male    DOB: [birth year], 44 y.o.   MRN: [id number]  HPI Patient presents for yearly preventative medicine examination. He is a pleasant 44 year old male who  has a past medical history of Anemia, Chicken pox, Depression, Drug abuse ([place]), Hyperlipidemia, and Schizophrenia ([place]).  Schizophrenia -is well controlled with Zyprexa.  Hyperlipidemia-  likely due to Zyprexa usage.  He takes simvastatin 20 mg daily.  He denies myalgia or fatigue  Lab Results  Component Value Date   CHOL 223 (H) 07/01/2018   HDL 41.60 07/01/2018   LDLCALC 154 (H) 07/01/2018   TRIG 138.0 07/01/2018   CHOLHDL 5 07/01/2018   Vitamin D Deficiency - takes OTC vitamin D supplement   All immunizations and health maintenance protocols were reviewed with the patient and needed orders were placed.  Appropriate screening laboratory values were ordered for the patient including screening of hyperlipidemia, renal function and hepatic function. If indicated by BPH, a PSA was ordered.  Medication reconciliation,  past medical history, social history, problem list and allergies were reviewed in detail with the patient  Goals were established with regard to weight loss, exercise, and  diet in compliance with medications. He is vegetarian and gets protein. He stays active and is enjoying cycling.    No acute issues today   Review of Systems See HPI   Past Medical History:  Diagnosis Date  . Anemia    Iron deficinecy - taking iron  . Chicken pox   . Depression    Long time ago - denies currently for 10 years  . Drug abuse ([place])   . Hyperlipidemia   . Schizophrenia ([place])    2002 - well controlled    Social History   Socioeconomic History  . Marital status: Divorced    Spouse name: [name]  . Number of children: 1  . Years of education: 45  . Highest education level: Not on file  Occupational History  . Occupation: Other    Comment: Works Psychologist, prison and probation services  Tobacco Use  . Smoking status: Former Smoker    Packs/day: 1.50    Years: 6.00    Pack years: 9.00  . Smokeless tobacco: Never Used  Vaping Use  . Vaping Use: Never used  Substance and Sexual Activity  . Alcohol use: Yes    Alcohol/week: 10.0 standard drinks    Types: 10 Cans of beer per week  . Drug use: No  . Sexual activity: Yes    Birth control/protection: Condom  Other Topics Concern  . Not on file  Social History Narrative   Born in [place], [place]   Raised [place]   Completed some college   Family brought to [facility]      Cycle and exercise, read,    Diet: eats a little bit of everything, stays away from fried food - eats lots of vegetables.    Social Determinants of Health   Financial Resource Strain:   . Difficulty of Paying Living Expenses:   Food Insecurity:   . Worried About Charity fundraiser in the Last Year:   . Arboriculturist in the Last Year:   Transportation Needs:   . Film/video editor (Medical):   Marland Kitchen Lack of Transportation (Non-Medical):   Physical Activity:   . Days of Exercise per Week:   . Minutes of Exercise per Session:   Stress:   . Feeling of Stress :  Social Connections:   . Frequency of Communication with Friends and Family:   . Frequency of Social Gatherings with Friends and Family:   . Attends Religious Services:   . Active Member of Clubs or Organizations:   . Attends Archivist Meetings:   Marland Kitchen Marital Status:   Intimate Partner Violence:   . Fear of Current or Ex-Partner:   . Emotionally Abused:   Marland Kitchen Physically Abused:   . Sexually Abused:     History reviewed. No pertinent surgical history.  Family History  Problem Relation Age of Onset  . Prostate cancer Maternal Uncle     No Known Allergies  Current Outpatient Medications on File Prior to Visit  Medication Sig Dispense Refill  . atorvastatin (LIPITOR) 40 MG tablet Take 1 tablet (40 mg total) by mouth daily. **DUE FOR YEARLY  PHYSICAL** 30 tablet 0  . nitroGLYCERIN (NITRODUR - DOSED IN MG/24 HR) 0.2 mg/hr patch APPLY 1/4 OF PATCH TO SKIN DAILY 90 patch 1  . UNABLE TO FIND CBD: USES IN PLACE OF NSAIDS    . vitamin B-12 (CYANOCOBALAMIN) 500 MCG tablet Take 500 mcg by mouth daily.    Marland Kitchen VITAMIN D PO Take 500 Units by mouth daily.      No current facility-administered medications on file prior to visit.    BP 106/76   Temp 98.1 F (36.7 C) (Oral)   Ht 5' 7.5" (1.715 m)   Wt 173 lb (78.5 kg)   BMI 26.70 kg/m       Objective:   Physical Exam Vitals and nursing note reviewed.  Constitutional:      General: He is not in acute distress.    Appearance: Normal appearance. He is well-developed and normal weight.  HENT:     Head: Normocephalic and atraumatic.     Right Ear: Tympanic membrane, ear canal and external ear normal. There is no impacted cerumen.     Left Ear: Tympanic membrane, ear canal and external ear normal. There is no impacted cerumen.     Nose: Nose normal. No congestion or rhinorrhea.     Mouth/Throat:     Mouth: Mucous membranes are moist.     Pharynx: Oropharynx is clear. No oropharyngeal exudate or posterior oropharyngeal erythema.  Eyes:     General:        Right eye: No discharge.        Left eye: No discharge.     Extraocular Movements: Extraocular movements intact.     Conjunctiva/sclera: Conjunctivae normal.     Pupils: Pupils are equal, round, and reactive to light.  Neck:     Vascular: No carotid bruit.     Trachea: No tracheal deviation.  Cardiovascular:     Rate and Rhythm: Normal rate and regular rhythm.     Pulses: Normal pulses.     Heart sounds: Normal heart sounds. No murmur heard.  No friction rub. No gallop.   Pulmonary:     Effort: Pulmonary effort is normal. No respiratory distress.     Breath sounds: Normal breath sounds. No stridor. No wheezing, rhonchi or rales.  Chest:     Chest wall: No tenderness.  Abdominal:     General: Bowel sounds are normal. There  is no distension.     Palpations: Abdomen is soft. There is no mass.     Tenderness: There is no abdominal tenderness. There is no right CVA tenderness, left CVA tenderness, guarding or rebound.     Hernia: No hernia  is present.  Musculoskeletal:        General: No swelling, tenderness, deformity or signs of injury. Normal range of motion.     Right lower leg: No edema.     Left lower leg: No edema.  Lymphadenopathy:     Cervical: No cervical adenopathy.  Skin:    General: Skin is warm and dry.     Capillary Refill: Capillary refill takes less than 2 seconds.     Coloration: Skin is not jaundiced or pale.     Findings: No bruising, erythema, lesion or rash.  Neurological:     General: No focal deficit present.     Mental Status: He is alert and oriented to person, place, and time.     Cranial Nerves: No cranial nerve deficit.     Sensory: No sensory deficit.     Motor: No weakness.     Coordination: Coordination normal.     Gait: Gait normal.     Deep Tendon Reflexes: Reflexes normal.  Psychiatric:        Mood and Affect: Mood normal.        Behavior: Behavior normal.        Thought Content: Thought content normal.        Judgment: Judgment normal.       Assessment & Plan:  1. Routine general medical examination at a health care facility - Continue to eat healthy and exercise - Follow up in one year or sooner if needed - CBC with Differential/Platelet; Future - Lipid panel; Future - TSH; Future - CMP with eGFR([facility]); Future - CMP with eGFR([facility]) - TSH - Lipid panel - CBC with Differential/Platelet  2. Mixed hyperlipidemia - Consider increase in statin  - CBC with Differential/Platelet; Future - Lipid panel; Future - TSH; Future - CMP with eGFR([facility]); Future - CMP with eGFR([facility]) - TSH - Lipid panel - CBC with Differential/Platelet  3. Paranoid schizophrenia ([place])  - OLANZapine (ZYPREXA) 5 MG tablet; TAKE 1 TABLET BY MOUTH DAILY.  Dispense: 90 tablet;  Refill: 1  4. Family history of prostate cancer * - PSA; Future - PSA  5. Vitamin D deficiency  - Vitamin D, 25-hydroxy; Future - Vitamin D, 25-hydroxy   Dorothyann Peng, NP

## 2020-01-13 ENCOUNTER — Other Ambulatory Visit: Payer: Self-pay | Admitting: Adult Health

## 2020-01-13 ENCOUNTER — Encounter: Payer: Self-pay | Admitting: Adult Health

## 2020-01-13 LAB — COMPLETE METABOLIC PANEL WITH GFR
AG Ratio: 1.6 (calc) (ref 1.0–2.5)
ALT: 24 U/L (ref 9–46)
AST: 17 U/L (ref 10–40)
Albumin: 4.4 g/dL (ref 3.6–5.1)
Alkaline phosphatase (APISO): 45 U/L (ref 36–130)
BUN: 8 mg/dL (ref 7–25)
CO2: 30 mmol/L (ref 20–32)
Calcium: 9.1 mg/dL (ref 8.6–10.3)
Chloride: 102 mmol/L (ref 98–110)
Creat: 1.03 mg/dL (ref 0.60–1.35)
GFR, Est African American: 103 mL/min/{1.73_m2} (ref >=60)
GFR, Est Non African American: 89 mL/min/{1.73_m2} (ref >=60)
Globulin: 2.8 g/dL (calc) (ref 1.9–3.7)
Glucose, Bld: 94 mg/dL (ref 65–99)
Potassium: 4.7 mmol/L (ref 3.5–5.3)
Sodium: 139 mmol/L (ref 135–146)
Total Bilirubin: 1.3 mg/dL — ABNORMAL HIGH (ref 0.2–1.2)
Total Protein: 7.2 g/dL (ref 6.1–8.1)

## 2020-01-13 LAB — LIPID PANEL
Cholesterol: 198 mg/dL (ref <200)
HDL: 39 mg/dL — ABNORMAL LOW (ref >=40)
LDL Cholesterol (Calc): 137 mg/dL (calc) — ABNORMAL HIGH
Non-HDL Cholesterol (Calc): 159 mg/dL (calc) — ABNORMAL HIGH (ref <130)
Total CHOL/HDL Ratio: 5.1 (calc) — ABNORMAL HIGH (ref <5.0)
Triglycerides: 111 mg/dL (ref <150)

## 2020-01-13 LAB — CBC WITH DIFFERENTIAL/PLATELET
Absolute Monocytes: 573 cells/uL (ref 200–950)
Basophils Absolute: 32 cells/uL (ref 0–200)
Basophils Relative: 0.5 %
Eosinophils Absolute: 170 cells/uL (ref 15–500)
Eosinophils Relative: 2.7 %
HCT: 39.4 % (ref 38.5–50.0)
Hemoglobin: 13.1 g/dL — ABNORMAL LOW (ref 13.2–17.1)
Lymphs Abs: 2489 cells/uL (ref 850–3900)
MCH: 27.1 pg (ref 27.0–33.0)
MCHC: 33.2 g/dL (ref 32.0–36.0)
MCV: 81.4 fL (ref 80.0–100.0)
MPV: 11.4 fL (ref 7.5–12.5)
Monocytes Relative: 9.1 %
Neutro Abs: 3037 cells/uL (ref 1500–7800)
Neutrophils Relative %: 48.2 %
Platelets: 145 10*3/uL (ref 140–400)
RBC: 4.84 10*6/uL (ref 4.20–5.80)
RDW: 13.5 % (ref 11.0–15.0)
Total Lymphocyte: 39.5 %
WBC: 6.3 10*3/uL (ref 3.8–10.8)

## 2020-01-13 LAB — TSH: TSH: 1.13 mIU/L (ref 0.40–4.50)

## 2020-01-13 LAB — VITAMIN D 25 HYDROXY (VIT D DEFICIENCY, FRACTURES): Vit D, 25-Hydroxy: 44 ng/mL (ref 30–100)

## 2020-01-13 LAB — PSA: PSA: 0.4 ng/mL (ref <=4.0)

## 2020-01-13 MED ORDER — ATORVASTATIN CALCIUM 80 MG PO TABS: 80.0000 mg | ORAL_TABLET | Freq: Every day | ORAL | 3 refills | Status: DC

## 2020-02-17 ENCOUNTER — Ambulatory Visit: Payer: Medicare HMO | Admitting: Family Medicine

## 2020-05-29 ENCOUNTER — Encounter: Payer: Self-pay | Admitting: Adult Health

## 2020-05-29 ENCOUNTER — Ambulatory Visit: Payer: Medicaid Other | Admitting: Adult Health

## 2020-05-29 ENCOUNTER — Other Ambulatory Visit: Payer: Self-pay

## 2020-05-29 VITALS — BP 128/78 | HR 70 | Temp 98.1°F | Ht 67.0 in | Wt 174.6 lb

## 2020-05-29 DIAGNOSIS — R5383 Other fatigue: Secondary | ICD-10-CM

## 2020-05-29 NOTE — Progress Notes (Signed)
Subjective:    Patient ID: Justin Sanchez, male    DOB: 1976-05-15, 44 y.o.   MRN: [id number]  HPI 44 year old male who  has a past medical history of Anemia, Chicken pox, Depression, Drug abuse (H[facility]), Hyperlipidemia, and Schizophrenia ([facility]).  He presents to the office today for an acute on chronic complaint of generalized fatigue that has been present for a few months. History of anemia and vitamin D deficiency. Taking 5000 units vitamin D daily and OTC Vitamin B 12. He is a vegetarian.   No CP or SOB   Review of Systems See HPI   Past Medical History:  Diagnosis Date  . Anemia    Iron deficinecy - taking iron  . Chicken pox   . Depression    Long time ago - denies currently for 10 years  . Drug abuse ([facility])   . Hyperlipidemia   . Schizophrenia ([facility])    2002 - well controlled    Social History   Socioeconomic History  . Marital status: Divorced    Spouse name: [name]  . Number of children: 1  . Years of education: 47  . Highest education level: Not on file  Occupational History  . Occupation: Other    Comment: Works Lobbyist  Tobacco Use  . Smoking status: Former Smoker    Packs/day: 1.50    Years: 6.00    Pack years: 9.00  . Smokeless tobacco: Never Used  Vaping Use  . Vaping Use: Never used  Substance and Sexual Activity  . Alcohol use: Yes    Alcohol/week: 10.0 standard drinks    Types: 10 Cans of beer per week  . Drug use: No  . Sexual activity: Yes    Birth control/protection: Condom  Other Topics Concern  . Not on file  Social History Narrative   Born in [place], [place]   Raised [place], [place]   Completed some college   Family brought to [facility]      Cycle and exercise, read,    Diet: eats a little bit of everything, stays away from fried food - eats lots of vegetables.    Social Determinants of Health   Financial Resource Strain: Not on file  Food Insecurity: Not on file  Transportation Needs: Not on file  Physical  Activity: Not on file  Stress: Not on file  Social Connections: Not on file  Intimate Partner Violence: Not on file    No past surgical history on file.  Family History  Problem Relation Age of Onset  . Prostate cancer Maternal Uncle     No Known Allergies  Current Outpatient Medications on File Prior to Visit  Medication Sig Dispense Refill  . atorvastatin (LIPITOR) 80 MG tablet Take 1 tablet (80 mg total) by mouth daily. **DUE FOR YEARLY PHYSICAL** 90 tablet 3  . OLANZapine (ZYPREXA) 5 MG tablet TAKE 1 TABLET BY MOUTH DAILY. 90 tablet 1  . UNABLE TO FIND CBD: USES IN PLACE OF NSAIDS    . vitamin B-12 (CYANOCOBALAMIN) 500 MCG tablet Take 500 mcg by mouth daily.    Marland Kitchen VITAMIN D PO Take 500 Units by mouth daily.     . nitroGLYCERIN (NITRODUR - DOSED IN MG/24 HR) 0.2 mg/hr patch APPLY 1/4 OF PATCH TO SKIN DAILY (Patient not taking: Reported on 05/29/2020) 90 patch 1   No current facility-administered medications on file prior to visit.    BP 128/78   Pulse 70   Temp 98.1  F (36.7 C) (Oral)   Ht 5\' 7"  (1.702 m)   Wt 174 lb 9.6 oz (79.2 kg)   SpO2 98%   BMI 27.35 kg/m       Objective:   Physical Exam Vitals and nursing note reviewed.  Constitutional:      Appearance: Normal appearance.  Cardiovascular:     Rate and Rhythm: Normal rate and regular rhythm.     Pulses: Normal pulses.     Heart sounds: Normal heart sounds.  Pulmonary:     Effort: Pulmonary effort is normal.     Breath sounds: Normal breath sounds.  Skin:    General: Skin is warm and dry.     Capillary Refill: Capillary refill takes less than 2 seconds.  Neurological:     General: No focal deficit present.     Mental Status: He is alert and oriented to person, place, and time.  Psychiatric:        Mood and Affect: Mood normal.        Behavior: Behavior normal.        Thought Content: Thought content normal.        Judgment: Judgment normal.       Assessment & Plan:  1. Fatigue, unspecified  type - Iron, TIBC and Ferritin Panel; Future - VITAMIN D 25 Hydroxy (Vit-D Deficiency, Fractures); Future - Vitamin B12; Future - CBC with Differential/Platelet; Future - Iron, TIBC and Ferritin Panel - VITAMIN D 25 Hydroxy (Vit-D Deficiency, Fractures) - Vitamin B12 - CBC with Differential/Platelet  , NP

## 2020-05-30 ENCOUNTER — Encounter: Payer: Self-pay | Admitting: Adult Health

## 2020-05-30 LAB — CBC WITH DIFFERENTIAL/PLATELET
Absolute Monocytes: 540 cells/uL (ref 200–950)
Basophils Absolute: 38 cells/uL (ref 0–200)
Basophils Relative: 0.5 %
Eosinophils Absolute: 150 cells/uL (ref 15–500)
Eosinophils Relative: 2 %
HCT: 39.3 % (ref 38.5–50.0)
Hemoglobin: 13.5 g/dL (ref 13.2–17.1)
Lymphs Abs: 2588 cells/uL (ref 850–3900)
MCH: 27.1 pg (ref 27.0–33.0)
MCHC: 34.4 g/dL (ref 32.0–36.0)
MCV: 78.9 fL — ABNORMAL LOW (ref 80.0–100.0)
MPV: 10.6 fL (ref 7.5–12.5)
Monocytes Relative: 7.2 %
Neutro Abs: 4185 cells/uL (ref 1500–7800)
Neutrophils Relative %: 55.8 %
Platelets: 254 10*3/uL (ref 140–400)
RBC: 4.98 10*6/uL (ref 4.20–5.80)
RDW: 13.8 % (ref 11.0–15.0)
Total Lymphocyte: 34.5 %
WBC: 7.5 10*3/uL (ref 3.8–10.8)

## 2020-05-30 LAB — IRON,TIBC AND FERRITIN PANEL
%SAT: 26 % (calc) (ref 20–48)
Ferritin: 98 ng/mL (ref 38–380)
Iron: 91 ug/dL (ref 50–180)
TIBC: 354 mcg/dL (calc) (ref 250–425)

## 2020-05-30 LAB — VITAMIN B12: Vitamin B-12: 1035 pg/mL (ref 200–1100)

## 2020-05-30 LAB — VITAMIN D 25 HYDROXY (VIT D DEFICIENCY, FRACTURES): Vit D, 25-Hydroxy: 55 ng/mL (ref 30–100)

## 2020-06-27 ENCOUNTER — Encounter: Payer: Self-pay | Admitting: Adult Health

## 2020-10-02 ENCOUNTER — Telehealth (INDEPENDENT_AMBULATORY_CARE_PROVIDER_SITE_OTHER): Payer: Medicaid Other | Admitting: Adult Health

## 2020-10-02 ENCOUNTER — Encounter: Payer: Self-pay | Admitting: Adult Health

## 2020-10-02 DIAGNOSIS — H6982 Other specified disorders of Eustachian tube, left ear: Secondary | ICD-10-CM

## 2020-10-02 NOTE — Progress Notes (Signed)
Virtual Visit via Video Note  I connected with Justin Sanchez  on 10/02/20 at  5:00 PM EDT by a video enabled telemedicine application and verified that I am speaking with the correct person using two identifiers.  Location patient: home Location provider:work or home office Persons participating in the virtual visit: patient, provider  I discussed the limitations of evaluation and management by telemedicine and the availability of in person appointments. The patient expressed understanding and agreed to proceed.   HPI: 45 year old male who is being evaluated today for an acute issue.  His symptoms started 5 days ago with left sided  ear discomfort and then 2 to 3 days ago he started having left-sided facial pain.  He also reports "stuffiness from my seasonal allergies".  He denies fevers, chills, or drainage from his ears.  Feels as though his ears need to pop but denies any decrease in hearing.  Has been using Claritin to help with his seasonal allergies.  He does not feel acutely ill   ROS: See pertinent positives and negatives per HPI.  Past Medical History:  Diagnosis Date  . Anemia    Iron deficinecy - taking iron  . Chicken pox   . Depression    Long time ago - denies currently for 10 years  . Drug abuse (HCC)   . Hyperlipidemia   . Schizophrenia ([facility])    2002 - well controlled    No past surgical history on file.  Family History  Problem Relation Age of Onset  . Prostate cancer Maternal Uncle        Current Outpatient Medications:  .  atorvastatin (LIPITOR) 80 MG tablet, Take 1 tablet (80 mg total) by mouth daily. **DUE FOR YEARLY PHYSICAL**, Disp: 90 tablet, Rfl: 3 .  nitroGLYCERIN (NITRODUR - DOSED IN MG/24 HR) 0.2 mg/hr patch, APPLY 1/4 OF PATCH TO SKIN DAILY (Patient not taking: Reported on 05/29/2020), Disp: 90 patch, Rfl: 1 .  OLANZapine (ZYPREXA) 5 MG tablet, TAKE 1 TABLET BY MOUTH DAILY., Disp: 90 tablet, Rfl: 1 .  UNABLE TO FIND, CBD: USES IN PLACE OF  NSAIDS, Disp: , Rfl:  .  vitamin B-12 (CYANOCOBALAMIN) 500 MCG tablet, Take 500 mcg by mouth daily., Disp: , Rfl:  .  VITAMIN D PO, Take 500 Units by mouth daily. , Disp: , Rfl:   EXAM:  VITALS per patient if applicable:  GENERAL: alert, oriented, appears well and in no acute distress  HEENT: atraumatic, conjunttiva clear, no obvious abnormalities on inspection of external nose and ears  NECK: normal movements of the head and neck  LUNGS: on inspection no signs of respiratory distress, breathing rate appears normal, no obvious gross SOB, gasping or wheezing  CV: no obvious cyanosis  MS: moves all visible extremities without noticeable abnormality  PSYCH/NEURO: pleasant and cooperative, no obvious depression or anxiety, speech and thought processing grossly intact  ASSESSMENT AND PLAN:  Discussed the following assessment and plan:  1. Eustachian tube dysfunction, left - Advised to either try Afrin for the next three days or can start using flonase - Follow up in office if no improvement   [name], NP       I discussed the assessment and treatment plan with the patient. The patient was provided an opportunity to ask questions and all were answered. The patient agreed with the plan and demonstrated an understanding of the instructions.   The patient was advised to call back or seek an in-person evaluation if the symptoms worsen or  if the condition fails to improve as anticipated.   [name], NP

## 2020-10-08 ENCOUNTER — Encounter: Payer: Self-pay | Admitting: Adult Health

## 2020-10-17 ENCOUNTER — Other Ambulatory Visit: Payer: Self-pay

## 2020-10-18 ENCOUNTER — Ambulatory Visit (INDEPENDENT_AMBULATORY_CARE_PROVIDER_SITE_OTHER): Payer: PRIVATE HEALTH INSURANCE | Admitting: Adult Health

## 2020-10-18 ENCOUNTER — Encounter: Payer: Self-pay | Admitting: Adult Health

## 2020-10-18 VITALS — BP 104/78 | HR 69 | Temp 98.2°F | Ht 67.0 in | Wt 173.4 lb

## 2020-10-18 DIAGNOSIS — H61892 Other specified disorders of left external ear: Secondary | ICD-10-CM

## 2020-10-18 NOTE — Progress Notes (Signed)
Subjective:    Patient ID: Justin Sanchez, male    DOB: September 05, 1975, 45 y.o.   MRN: [id number]  HPI  45 year old male who  has a past medical history of Anemia, Chicken pox, Depression, Drug abuse (HCC), Hyperlipidemia, and Schizophrenia ([facility]).  He presents to the office today for an acute issue.  Feels as though "something is in my left ear, like a tickling sensation that happens throughout the day".  He denies pain or drainage.  Review of Systems See HPI   Past Medical History:  Diagnosis Date  . Anemia    Iron deficinecy - taking iron  . Chicken pox   . Depression    Long time ago - denies currently for 10 years  . Drug abuse ([facility]CC)   . Hyperlipidemia   . Schizophrenia ([facility])    2002 - well controlled    Social History   Socioeconomic History  . Marital status: Divorced    Spouse name: [name]  . Number of children: 1  . Years of education: 12  . Highest education level: Not on file  Occupational History  . Occupation: Other    Comment: Works Lobbyist  Tobacco Use  . Smoking status: Former Smoker    Packs/day: 1.50    Years: 6.00    Pack years: 9.00  . Smokeless tobacco: Never Used  Vaping Use  . Vaping Use: Never used  Substance and Sexual Activity  . Alcohol use: Yes    Alcohol/week: 10.0 standard drinks    Types: 10 Cans of beer per week  . Drug use: No  . Sexual activity: Yes    Birth control/protection: Condom  Other Topics Concern  . Not on file  Social History Narrative   Born in [place], [place]   Raised [place], [place]   Completed some college   Family brought to [facility]      Cycle and exercise, read,    Diet: eats a little bit of everything, stays away from fried food - eats lots of vegetables.    Social Determinants of Health   Financial Resource Strain: Not on file  Food Insecurity: Not on file  Transportation Needs: Not on file  Physical Activity: Not on file  Stress: Not on file  Social Connections: Not on file   Intimate Partner Violence: Not on file    History reviewed. No pertinent surgical history.  Family History  Problem Relation Age of Onset  . Prostate cancer Maternal Uncle     No Known Allergies  Current Outpatient Medications on File Prior to Visit  Medication Sig Dispense Refill  . atorvastatin (LIPITOR) 80 MG tablet Take 1 tablet (80 mg total) by mouth daily. **DUE FOR YEARLY PHYSICAL** 90 tablet 3  . OLANZapine (ZYPREXA) 5 MG tablet TAKE 1 TABLET BY MOUTH DAILY. 90 tablet 1  . vitamin B-12 (CYANOCOBALAMIN) 500 MCG tablet Take 500 mcg by mouth daily.    Marland Kitchen VITAMIN D PO Take 500 Units by mouth daily.      No current facility-administered medications on file prior to visit.    BP 104/78 (BP Location: Left Arm, Patient Position: Sitting, Cuff Size: Normal)   Pulse 69   Temp 98.2 F (36.8 C) (Oral)   Ht 5\' 7"  (1.702 m)   Wt 173 lb 6.4 oz (78.7 kg)   BMI 27.16 kg/m       Objective:   Physical Exam Vitals and nursing note reviewed.  Constitutional:  Appearance: Normal appearance.  HENT:     Right Ear: Hearing normal.     Left Ear: Hearing, tympanic membrane and external ear normal. No drainage, swelling or tenderness.  No middle ear effusion. Tympanic membrane is not injected, scarred, erythematous or bulging. Tympanic membrane has normal mobility.     Ears:     Comments: Dry skin noted in left ear canal  Neurological:     Mental Status: He is alert.       Assessment & Plan:  1. Dryness of ear canal, left -Completely normal exam except for right skin noted throughout left ear canal.  Advised cleaning his ears out less often, can use small amount of all of oil to lubricate the ear canal. Follow-up as needed  [name], NP

## 2020-10-24 ENCOUNTER — Encounter: Payer: Self-pay | Admitting: Adult Health

## 2020-10-25 ENCOUNTER — Other Ambulatory Visit: Payer: Self-pay | Admitting: Adult Health

## 2020-10-25 DIAGNOSIS — H6982 Other specified disorders of Eustachian tube, left ear: Secondary | ICD-10-CM

## 2020-11-27 ENCOUNTER — Ambulatory Visit (INDEPENDENT_AMBULATORY_CARE_PROVIDER_SITE_OTHER): Payer: PRIVATE HEALTH INSURANCE | Admitting: Otolaryngology

## 2021-01-03 ENCOUNTER — Ambulatory Visit (INDEPENDENT_AMBULATORY_CARE_PROVIDER_SITE_OTHER): Payer: PRIVATE HEALTH INSURANCE | Admitting: Otolaryngology

## 2021-01-25 ENCOUNTER — Encounter: Payer: Self-pay | Admitting: Adult Health

## 2021-01-25 ENCOUNTER — Ambulatory Visit (INDEPENDENT_AMBULATORY_CARE_PROVIDER_SITE_OTHER): Payer: PRIVATE HEALTH INSURANCE | Admitting: Adult Health

## 2021-01-25 ENCOUNTER — Other Ambulatory Visit: Payer: Self-pay

## 2021-01-25 VITALS — BP 120/70 | HR 71 | Temp 98.5°F | Ht 67.0 in | Wt 164.0 lb

## 2021-01-25 DIAGNOSIS — M5442 Lumbago with sciatica, left side: Secondary | ICD-10-CM | POA: Diagnosis not present

## 2021-01-25 NOTE — Progress Notes (Signed)
Subjective:    Patient ID: Justin Sanchez, male    DOB: [birth year], 45 y.o.   MRN: [id number]  HPI 45 year old male who  has a past medical history of Anemia, Chicken pox, Depression, Drug abuse (HCC), Hyperlipidemia, and Schizophrenia ([facility]CC).  He presents to the office today for an acute issue.  Symptoms started about 2 weeks ago.  Symptoms include radiating pain from his left lower back down the outside of his left leg and into his left groin.  At home he was using Aleve and warm soaks.  Over the last few days his pain has resolved.  Pain was worse with movements and deep breathing.   Review of Systems See HPI   Past Medical History:  Diagnosis Date   Anemia    Iron deficinecy - taking iron   Chicken pox    Depression    Long time ago - denies currently for 10 years   Drug abuse (HCC)    Hyperlipidemia    Schizophrenia (HCC)    2002 - well controlled    Social History   Socioeconomic History   Marital status: Divorced    Spouse name: [name]   Number of children: 1   Years of education: 14   Highest education level: Not on file  Occupational History   Occupation: Other    Comment: Works Lobbyist  Tobacco Use   Smoking status: Former    Packs/day: 1.50    Years: 6.00    Pack years: 9.00    Types: Cigarettes   Smokeless tobacco: Never  Vaping Use   Vaping Use: Never used  Substance and Sexual Activity   Alcohol use: Yes    Alcohol/week: 10.0 standard drinks    Types: 10 Cans of beer per week   Drug use: No   Sexual activity: Yes    Birth control/protection: Condom  Other Topics Concern   Not on file  Social History Narrative   Born in [place], [place]   Raised [place], [place]   Completed some college   Family brought to [place]      Cycle and exercise, read,    Diet: eats a little bit of everything, stays away from fried food - eats lots of vegetables.    Social Determinants of Health   Financial Resource Strain: Not on file  Food  Insecurity: Not on file  Transportation Needs: Not on file  Physical Activity: Not on file  Stress: Not on file  Social Connections: Not on file  Intimate Partner Violence: Not on file    History reviewed. No pertinent surgical history.  Family History  Problem Relation Age of Onset   Prostate cancer Maternal Uncle     No Known Allergies  Current Outpatient Medications on File Prior to Visit  Medication Sig Dispense Refill   atorvastatin (LIPITOR) 80 MG tablet Take 1 tablet (80 mg total) by mouth daily. **DUE FOR YEARLY PHYSICAL** 90 tablet 3   OLANZapine (ZYPREXA) 5 MG tablet TAKE 1 TABLET BY MOUTH DAILY. 90 tablet 1   vitamin B-12 (CYANOCOBALAMIN) 500 MCG tablet Take 500 mcg by mouth daily.     VITAMIN D PO Take 500 Units by mouth daily.      No current facility-administered medications on file prior to visit.    BP 120/70   Pulse 71   Temp 98.5 F (36.9 C) (Oral)   Ht 5\' 7"  (1.702 m)   Wt 164 lb (74.4 kg)   SpO2  97%   BMI 25.69 kg/m       Objective:   Physical Exam Vitals and nursing note reviewed.  Constitutional:      Appearance: Normal appearance.  Musculoskeletal:        General: No swelling or tenderness. Normal range of motion.  Skin:    General: Skin is warm and dry.     Capillary Refill: Capillary refill takes less than 2 seconds.  Neurological:     General: No focal deficit present.     Mental Status: He is alert and oriented to person, place, and time.  Psychiatric:        Mood and Affect: Mood normal.        Behavior: Behavior normal.        Thought Content: Thought content normal.        Judgment: Judgment normal.      Assessment & Plan:  1. Acute left-sided low back pain with left-sided sciatica -Likely sciatica that has resolved with conservative treatments.  Encourage stretching exercises, warm compress, and Aleve if symptoms come back.  Follow-up as needed.  [name], NP

## 2021-02-07 ENCOUNTER — Ambulatory Visit (INDEPENDENT_AMBULATORY_CARE_PROVIDER_SITE_OTHER): Payer: PRIVATE HEALTH INSURANCE | Admitting: Otolaryngology

## 2021-02-21 ENCOUNTER — Encounter: Payer: Self-pay | Admitting: Adult Health

## 2021-02-21 DIAGNOSIS — F200 Paranoid schizophrenia: Secondary | ICD-10-CM

## 2021-02-22 MED ORDER — ATORVASTATIN CALCIUM 80 MG PO TABS: 80.0000 mg | ORAL_TABLET | Freq: Every day | ORAL | 0 refills | Status: DC

## 2021-02-22 MED ORDER — OLANZAPINE 5 MG PO TABS: ORAL_TABLET | ORAL | 0 refills | Status: DC

## 2021-02-25 ENCOUNTER — Other Ambulatory Visit: Payer: Self-pay | Admitting: Adult Health

## 2021-02-25 DIAGNOSIS — F200 Paranoid schizophrenia: Secondary | ICD-10-CM

## 2021-03-08 ENCOUNTER — Encounter: Payer: Self-pay | Admitting: Adult Health

## 2021-03-08 ENCOUNTER — Other Ambulatory Visit: Payer: Self-pay

## 2021-03-08 ENCOUNTER — Ambulatory Visit (INDEPENDENT_AMBULATORY_CARE_PROVIDER_SITE_OTHER): Payer: Medicaid Other | Admitting: Adult Health

## 2021-03-08 VITALS — BP 110/70 | HR 64 | Temp 98.7°F | Ht 67.0 in | Wt 170.0 lb

## 2021-03-08 DIAGNOSIS — E559 Vitamin D deficiency, unspecified: Secondary | ICD-10-CM | POA: Diagnosis not present

## 2021-03-08 DIAGNOSIS — E782 Mixed hyperlipidemia: Secondary | ICD-10-CM | POA: Diagnosis not present

## 2021-03-08 DIAGNOSIS — Z0000 Encounter for general adult medical examination without abnormal findings: Secondary | ICD-10-CM | POA: Diagnosis not present

## 2021-03-08 DIAGNOSIS — F200 Paranoid schizophrenia: Secondary | ICD-10-CM

## 2021-03-08 DIAGNOSIS — E538 Deficiency of other specified B group vitamins: Secondary | ICD-10-CM

## 2021-03-08 DIAGNOSIS — Z23 Encounter for immunization: Secondary | ICD-10-CM | POA: Diagnosis not present

## 2021-03-08 MED ORDER — OLANZAPINE 5 MG PO TABS: ORAL_TABLET | ORAL | 3 refills | Status: DC

## 2021-03-08 MED ORDER — ATORVASTATIN CALCIUM 80 MG PO TABS: 80.0000 mg | ORAL_TABLET | Freq: Every day | ORAL | 3 refills | Status: DC

## 2021-03-08 NOTE — Progress Notes (Signed)
Subjective:    Patient ID: Justin Sanchez, male    DOB: 1975/11/15, 45 y.o.   MRN: [id number]  HPI Patient presents for yearly preventative medicine examination. He is a pleasant 45 year old male who  has a past medical history of Anemia, Chicken pox, Depression, Drug abuse (HCC), Hyperlipidemia, and Schizophrenia (HCC).  Schizophrenia-is well controlled with Zyprexa.  Denies delusions, hallucinations, disorganized thinking or speech.  Hyperlipidemia-prescribed Lipitor 80 mg. Thought to be likely due to Zyprexa usage.  He denies myalgia or fatigue Lab Results  Component Value Date   CHOL 198 01/12/2020   HDL 39 (L) 01/12/2020   LDLCALC 137 (H) 01/12/2020   TRIG 111 01/12/2020   CHOLHDL 5.1 (H) 01/12/2020   Vitamin D Deficiency -takes over-the-counter vitamin D supplement Last vitamin D Lab Results  Component Value Date   VD25OH 55 05/29/2020    All immunizations and health maintenance protocols were reviewed with the patient and needed orders were placed.  Appropriate screening laboratory values were ordered for the patient including screening of hyperlipidemia, renal function and hepatic function.   Medication reconciliation,  past medical history, social history, problem list and allergies were reviewed in detail with the patient  Goals were established with regard to weight loss, exercise, and  diet in compliance with medications   Review of Systems  Constitutional: Negative.   HENT: Negative.    Eyes: Negative.   Respiratory: Negative.    Cardiovascular: Negative.   Gastrointestinal: Negative.   Endocrine: Negative.   Genitourinary: Negative.   Musculoskeletal: Negative.   Skin: Negative.   Allergic/Immunologic: Negative.   Neurological: Negative.   Hematological: Negative.   Psychiatric/Behavioral: Negative.    All other systems reviewed and are negative.  Past Medical History:  Diagnosis Date   Anemia    Iron deficinecy - taking iron   Chicken pox     Depression    Long time ago - denies currently for 10 years   Drug abuse (HCC)    Hyperlipidemia    Schizophrenia (HCC)    2002 - well controlled    Social History   Socioeconomic History   Marital status: Divorced    Spouse name: [name]   Number of children: 1   Years of education: 14   Highest education level: Not on file  Occupational History   Occupation: Other    Comment: Works Lobbyist  Tobacco Use   Smoking status: Former    Packs/day: 1.50    Years: 6.00    Pack years: 9.00    Types: Cigarettes   Smokeless tobacco: Never  Vaping Use   Vaping Use: Never used  Substance and Sexual Activity   Alcohol use: Yes    Alcohol/week: 10.0 standard drinks    Types: 10 Cans of beer per week   Drug use: No   Sexual activity: Yes    Birth control/protection: Condom  Other Topics Concern   Not on file  Social History Narrative   Born in [place], [place]   Raised [place], [place]   Completed some college   Family brought to [place]      Cycle and exercise, read,    Diet: eats a little bit of everything, stays away from fried food - eats lots of vegetables.    Social Determinants of Health   Financial Resource Strain: Not on file  Food Insecurity: Not on file  Transportation Needs: Not on file  Physical Activity: Not on file  Stress: Not on  file  Social Connections: Not on file  Intimate Partner Violence: Not on file    No past surgical history on file.  Family History  Problem Relation Age of Onset   Prostate cancer Maternal Uncle     No Known Allergies  Current Outpatient Medications on File Prior to Visit  Medication Sig Dispense Refill   atorvastatin (LIPITOR) 80 MG tablet Take 1 tablet (80 mg total) by mouth daily. **DUE FOR YEARLY PHYSICAL** 30 tablet 0   OLANZapine (ZYPREXA) 5 MG tablet TAKE 1 TABLET BY MOUTH DAILY. 30 tablet 0   vitamin B-12 (CYANOCOBALAMIN) 500 MCG tablet Take 500 mcg by mouth daily.     VITAMIN D PO Take 500  Units by mouth daily.      No current facility-administered medications on file prior to visit.    There were no vitals taken for this visit.       Objective:   Physical Exam Vitals and nursing note reviewed.  Constitutional:      General: He is not in acute distress.    Appearance: Normal appearance. He is well-developed and normal weight.  HENT:     Head: Normocephalic and atraumatic.     Right Ear: Tympanic membrane, ear canal and external ear normal. There is no impacted cerumen.     Left Ear: Tympanic membrane, ear canal and external ear normal. There is no impacted cerumen.     Nose: Nose normal. No congestion or rhinorrhea.     Mouth/Throat:     Mouth: Mucous membranes are moist.     Pharynx: Oropharynx is clear. No oropharyngeal exudate or posterior oropharyngeal erythema.  Eyes:     General:        Right eye: No discharge.        Left eye: No discharge.     Extraocular Movements: Extraocular movements intact.     Conjunctiva/sclera: Conjunctivae normal.     Pupils: Pupils are equal, round, and reactive to light.  Neck:     Vascular: No carotid bruit.     Trachea: No tracheal deviation.  Cardiovascular:     Rate and Rhythm: Normal rate and regular rhythm.     Pulses: Normal pulses.     Heart sounds: Normal heart sounds. No murmur heard.   No friction rub. No gallop.  Pulmonary:     Effort: Pulmonary effort is normal. No respiratory distress.     Breath sounds: Normal breath sounds. No stridor. No wheezing, rhonchi or rales.  Chest:     Chest wall: No tenderness.  Abdominal:     General: Bowel sounds are normal. There is no distension.     Palpations: Abdomen is soft. There is no mass.     Tenderness: There is no abdominal tenderness. There is no right CVA tenderness, left CVA tenderness, guarding or rebound.     Hernia: No hernia is present.  Musculoskeletal:        General: No swelling, tenderness, deformity or signs of injury. Normal range of motion.      Right lower leg: No edema.     Left lower leg: No edema.     Comments: Lipoma noted on right mid back   Lymphadenopathy:     Cervical: No cervical adenopathy.  Skin:    General: Skin is warm and dry.     Capillary Refill: Capillary refill takes less than 2 seconds.     Coloration: Skin is not jaundiced or pale.     Findings: No bruising,  erythema, lesion or rash.  Neurological:     General: No focal deficit present.     Mental Status: He is alert and oriented to person, place, and time.     Cranial Nerves: No cranial nerve deficit.     Sensory: No sensory deficit.     Motor: No weakness.     Coordination: Coordination normal.     Gait: Gait normal.     Deep Tendon Reflexes: Reflexes normal.  Psychiatric:        Mood and Affect: Mood normal.        Behavior: Behavior normal.        Thought Content: Thought content normal.        Judgment: Judgment normal.      Assessment & Plan:   1. Routine general medical examination at a health care facility - Benign exam  - Follow up in one year or sooner if needed - CBC with Differential/Platelet; Future - Comprehensive metabolic panel; Future - Lipid panel; Future - PSA; Future - TSH; Future  2. Paranoid schizophrenia (HCC)  - OLANZapine (ZYPREXA) 5 MG tablet; TAKE 1 TABLET BY MOUTH DAILY.  Dispense: 90 tablet; Refill: 3  3. Mixed hyperlipidemia  - CBC with Differential/Platelet; Future - Comprehensive metabolic panel; Future - Lipid panel; Future - PSA; Future - TSH; Future - atorvastatin (LIPITOR) 80 MG tablet; Take 1 tablet (80 mg total) by mouth daily.  Dispense: 90 tablet; Refill: 3  4. Vitamin D deficiency  - VITAMIN D 25 Hydroxy (Vit-D Deficiency, Fractures); Future  5. B12 deficiency  - Vitamin B12; Future  6. Need for Tdap vaccination  - Tdap vaccine greater than or equal to 7yo IM  [name], NP

## 2021-04-29 ENCOUNTER — Encounter: Payer: Self-pay | Admitting: Adult Health

## 2021-04-30 ENCOUNTER — Encounter: Payer: Self-pay | Admitting: Adult Health

## 2021-04-30 MED ORDER — VALACYCLOVIR HCL 500 MG PO TABS: 500.0000 mg | ORAL_TABLET | Freq: Two times a day (BID) | ORAL | 2 refills | Status: DC

## 2021-04-30 NOTE — Telephone Encounter (Signed)
Has not been refilled since 2018. Ok to fill?

## 2021-05-13 ENCOUNTER — Other Ambulatory Visit: Payer: Self-pay

## 2021-05-13 ENCOUNTER — Ambulatory Visit
Admission: RE | Admit: 2021-05-13 | Discharge: 2021-05-13 | Disposition: A | Discharge status: Home / Self Care | Payer: Self-pay | Source: Ambulatory Visit | Attending: Nurse Practitioner | Admitting: Nurse Practitioner

## 2021-05-13 ENCOUNTER — Other Ambulatory Visit: Payer: Self-pay | Admitting: Nurse Practitioner

## 2021-05-13 DIAGNOSIS — T1490XA Injury, unspecified, initial encounter: Secondary | ICD-10-CM

## 2021-09-27 ENCOUNTER — Ambulatory Visit: Payer: 59 | Admitting: Podiatry

## 2021-10-11 ENCOUNTER — Encounter: Payer: Self-pay | Admitting: Podiatry

## 2021-10-11 ENCOUNTER — Ambulatory Visit: Payer: 59 | Admitting: Podiatry

## 2021-10-11 ENCOUNTER — Other Ambulatory Visit: Payer: Self-pay | Admitting: Podiatry

## 2021-10-11 ENCOUNTER — Encounter: Payer: Self-pay | Admitting: Adult Health

## 2021-10-11 DIAGNOSIS — M79674 Pain in right toe(s): Secondary | ICD-10-CM | POA: Diagnosis not present

## 2021-10-11 DIAGNOSIS — B351 Tinea unguium: Secondary | ICD-10-CM

## 2021-10-11 DIAGNOSIS — L600 Ingrowing nail: Secondary | ICD-10-CM

## 2021-10-11 MED ORDER — TERBINAFINE HCL 250 MG PO TABS: 250.0000 mg | ORAL_TABLET | Freq: Every day | ORAL | 0 refills | Status: DC

## 2021-10-11 NOTE — Progress Notes (Signed)
Subjective:  ? ?Patient ID: Justin Sanchez, male   DOB: 46 y.o.   MRN: [id number]  ? ?HPI ?Patient presents stating that he has problems with his toenails they get thick and dystrophic and its been this way for years and also he has an ingrown toenail with spicule on the right big toe medial side that is bothersome for him and makes it irritated.  States it is discolored and he would like to try to do something overall for his feet.  Patient does not smoke likes to be active ? ? ?Review of Systems  ?All other systems reviewed and are negative. ? ? ?   ?Objective:  ?Physical Exam ?Vitals and nursing note reviewed.  ?Constitutional:   ?   Appearance: He is well-developed.  ?Pulmonary:  ?   Effort: Pulmonary effort is normal.  ?Musculoskeletal:     ?   General: Normal range of motion.  ?Skin: ?   General: Skin is warm.  ?Neurological:  ?   Mental Status: He is alert.  ?  ?Neurovascular status intact muscle strength found to be adequate range of motion adequate patient is found to have an incurvated medial border right hallux that may have been damaged in the past with a spicule of nail bed and thickened discoloration.  All other nails have thickness and discoloration consistent with probable fungal infection.  Good digital perfusion well oriented x3 ? ?   ?Assessment:  ?Ingrown toenail with damaged medial border right hallux along with nail thickness 1-5 both feet ? ?   ?Plan:  ?H&P reviewed all conditions and I recommended removal of the border of the right hallux and patient wants this done and I explained procedure risk and patient wants this fixed.  He signed consent form I infiltrated the right hallux 60 mg like Marcaine mixture sterile prep done and using sterile instrumentation remove the medial border exposed matrix applied phenol 3 applications 30 seconds followed by alcohol lavage sterile dressing gave instructions on soaks reappoint to recheck.  Also I went ahead and I am sending for liver function studies  and I am ordering Lamisil 1 pill/day 90 days explained the risk to him and if I see anything on liver function I will let him know ?   ? ? ?

## 2021-10-11 NOTE — Patient Instructions (Signed)

## 2021-10-12 LAB — HEPATIC FUNCTION PANEL (6)
ALT: 38 IU/L (ref 0–44)
AST: 29 IU/L (ref 0–40)
Albumin: 4.4 g/dL (ref 4.0–5.0)
Alkaline Phosphatase: 58 IU/L (ref 44–121)
Bilirubin Total: 0.7 mg/dL (ref 0.0–1.2)
Bilirubin, Direct: 0.18 mg/dL (ref 0.00–0.40)

## 2021-10-14 NOTE — Telephone Encounter (Signed)
Please advise 

## 2021-11-21 ENCOUNTER — Ambulatory Visit (INDEPENDENT_AMBULATORY_CARE_PROVIDER_SITE_OTHER): Payer: 59 | Admitting: Family Medicine

## 2021-11-21 ENCOUNTER — Other Ambulatory Visit: Payer: Self-pay

## 2021-11-21 ENCOUNTER — Encounter: Payer: Self-pay | Admitting: Family Medicine

## 2021-11-21 VITALS — BP 116/76 | HR 62 | Temp 98.7°F | Ht 67.0 in | Wt 174.4 lb

## 2021-11-21 DIAGNOSIS — J029 Acute pharyngitis, unspecified: Secondary | ICD-10-CM | POA: Diagnosis not present

## 2021-11-21 LAB — POC COVID19 BINAXNOW: SARS Coronavirus 2 Ag: NEGATIVE

## 2021-11-21 LAB — POCT RAPID STREP A (OFFICE): Rapid Strep A Screen: NEGATIVE

## 2021-11-21 MED ORDER — AMOXICILLIN-POT CLAVULANATE 875-125 MG PO TABS: 1.0000 | ORAL_TABLET | Freq: Two times a day (BID) | ORAL | 0 refills | Status: DC

## 2021-11-21 NOTE — Progress Notes (Signed)
   Subjective:    Patient ID: Justin Sanchez, male    DOB: [birth year], 46 y.o.   MRN: [id number]  HPI Here for 2 days of a mild ST and swollen nodes in the neck. No fever or cough. His daughter was found to be positive for a strep infection yesterday and is being treated with Augmentin.   Review of Systems  Constitutional: Negative.   HENT:  Positive for sore throat. Negative for congestion, ear pain and postnasal drip.   Eyes: Negative.   Respiratory: Negative.         Objective:   Physical Exam Constitutional:      Appearance: Normal appearance. He is not ill-appearing.  HENT:     Right Ear: Tympanic membrane, ear canal and external ear normal.     Left Ear: Tympanic membrane, ear canal and external ear normal.     Nose: Nose normal.     Mouth/Throat:     Pharynx: Oropharynx is clear.  Eyes:     Conjunctiva/sclera: Conjunctivae normal.  Neck:     Comments: There are several enlarged AC nodes  Pulmonary:     Effort: Pulmonary effort is normal.     Breath sounds: Normal breath sounds.  Neurological:     Mental Status: He is alert.           Assessment & Plan:  Early pharyngitis, likely due to a strep infection. Treat with 10 days of Augmentin. Gershon Crane, MD

## 2021-12-13 ENCOUNTER — Encounter: Payer: Self-pay | Admitting: Adult Health

## 2021-12-13 ENCOUNTER — Ambulatory Visit (INDEPENDENT_AMBULATORY_CARE_PROVIDER_SITE_OTHER): Payer: 59 | Admitting: Adult Health

## 2021-12-13 VITALS — BP 120/86 | HR 65 | Temp 98.6°F | Ht 67.0 in | Wt 183.0 lb

## 2021-12-13 DIAGNOSIS — M79642 Pain in left hand: Secondary | ICD-10-CM | POA: Diagnosis not present

## 2021-12-13 MED ORDER — VALACYCLOVIR HCL 500 MG PO TABS: 500.0000 mg | ORAL_TABLET | Freq: Two times a day (BID) | ORAL | 2 refills | Status: DC

## 2021-12-13 NOTE — Progress Notes (Signed)
Subjective:    Patient ID: Justin Sanchez, male    DOB: [birth year], 46 y.o.   MRN: [id number]  HPI 46 year old male who  has a past medical history of Anemia, Chicken pox, Depression, Drug abuse (HCC), Hyperlipidemia, and Schizophrenia (HCC).  He presents to the office today for an acute issue of left hand pain x1 week.  Pain is located along pinky and lateral aspect of his hand.  Denies trauma but does use his hands a lot at work.  Feels as though he has decreased grip strength.  He has not been using anything at home to help with the pain   Review of Systems See HPI   Past Medical History:  Diagnosis Date   Anemia    Iron deficinecy - taking iron   Chicken pox    Depression    Long time ago - denies currently for 10 years   Drug abuse (HCC)    Hyperlipidemia    Schizophrenia (HCC)    2002 - well controlled    Social History   Socioeconomic History   Marital status: Single    Spouse name: [name]   Number of children: 1   Years of education: 14   Highest education level: Not on file  Occupational History   Occupation: Other    Comment: Works Lobbyist  Tobacco Use   Smoking status: Former    Packs/day: 1.50    Years: 6.00    Total pack years: 9.00    Types: Cigarettes   Smokeless tobacco: Never  Vaping Use   Vaping Use: Never used  Substance and Sexual Activity   Alcohol use: Yes    Alcohol/week: 10.0 standard drinks of alcohol    Types: 10 Cans of beer per week   Drug use: No   Sexual activity: Yes    Birth control/protection: Condom  Other Topics Concern   Not on file  Social History Narrative   Born in [place], [place]   Raised [place], [place]   Completed some college   Family brought to [place]      Cycle and exercise, read,    Diet: eats a little bit of everything, stays away from fried food - eats lots of vegetables.    Social Determinants of Health   Financial Resource Strain: Not on file  Food Insecurity: Not on file   Transportation Needs: Not on file  Physical Activity: Not on file  Stress: Not on file  Social Connections: Not on file  Intimate Partner Violence: Not on file    History reviewed. No pertinent surgical history.  Family History  Problem Relation Age of Onset   Prostate cancer Maternal Uncle     No Known Allergies  Current Outpatient Medications on File Prior to Visit  Medication Sig Dispense Refill   atorvastatin (LIPITOR) 80 MG tablet Take 1 tablet (80 mg total) by mouth daily. 90 tablet 3   OLANZapine (ZYPREXA) 5 MG tablet TAKE 1 TABLET BY MOUTH DAILY. 90 tablet 3   vitamin B-12 (CYANOCOBALAMIN) 500 MCG tablet Take 500 mcg by mouth daily.     VITAMIN D PO Take 500 Units by mouth daily.      simvastatin (ZOCOR) 20 MG tablet      No current facility-administered medications on file prior to visit.    BP 120/86   Pulse 65   Temp 98.6 F (37 C) (Oral)   Ht 5\' 7"  (1.702 m)   Wt 183 lb (83  kg)   SpO2 96%   BMI 28.66 kg/m       Objective:   Physical Exam Vitals and nursing note reviewed.  Constitutional:      Appearance: Normal appearance.  Musculoskeletal:        General: Tenderness present. No swelling or deformity.     Left hand: Tenderness and bony tenderness present. No swelling. Normal range of motion. Normal strength. Normal sensation. There is no disruption of two-point discrimination. Normal capillary refill. Normal pulse.     Comments: Did have some tenderness to the soft tissue along the lateral aspect of his left hand as well as his PIP joint of the fifth finger.  No loss of grip strength or decreased range of motion.  Neurological:     General: No focal deficit present.     Mental Status: He is alert and oriented to person, place, and time.  Psychiatric:        Mood and Affect: Mood normal.        Behavior: Behavior normal.        Thought Content: Thought content normal.       Assessment & Plan:   1. Left hand pain -Concern for fracture or  dislocation.  Likely overuse injury.  Encouraged anti-inflammatories, ice, and rest.  Follow-up if not improving over the next 2 to 3 weeks  [name], NP

## 2022-01-01 ENCOUNTER — Other Ambulatory Visit: Payer: Self-pay | Admitting: Adult Health

## 2022-01-22 ENCOUNTER — Other Ambulatory Visit: Payer: Self-pay | Admitting: Adult Health

## 2022-02-11 ENCOUNTER — Ambulatory Visit (INDEPENDENT_AMBULATORY_CARE_PROVIDER_SITE_OTHER): Payer: 59 | Admitting: Adult Health

## 2022-02-11 VITALS — BP 120/80 | HR 70 | Temp 98.5°F | Ht 67.0 in | Wt 176.0 lb

## 2022-02-11 DIAGNOSIS — Z1211 Encounter for screening for malignant neoplasm of colon: Secondary | ICD-10-CM

## 2022-02-11 DIAGNOSIS — R200 Anesthesia of skin: Secondary | ICD-10-CM | POA: Diagnosis not present

## 2022-02-11 NOTE — Addendum Note (Signed)
Addended by: Nancy Fetter on: 02/11/2022 05:36 PM   Modules accepted: Orders

## 2022-02-11 NOTE — Progress Notes (Addendum)
Subjective:    Patient ID: Justin Sanchez, male    DOB: 24-Nov-1975, 46 y.o.   MRN: [id number]  HPI  46 year old male who  has a past medical history of Anemia, Chicken pox, Depression, Drug abuse (HCC), Hyperlipidemia, and Schizophrenia (HCC).  He presents to the office today for an acute issue.  Reports numbness to his right pinky toe.  He does report that he was seen at urgent care last week and prescribed prednisone for 5 days but reports no improvement since finishing his prednisone.  He does report have pain feeling in his right pinky toe but it just feels numb.  He has been working longer hours and steel toe boots and feels as though this is what caused the numbness.  He denies trauma or injury   He would also like to have his colonoscopy scheduled as he is turning 46 in a week  Review of Systems See HPI   Past Medical History:  Diagnosis Date   Anemia    Iron deficinecy - taking iron   Chicken pox    Depression    Long time ago - denies currently for 10 years   Drug abuse (HCC)    Hyperlipidemia    Schizophrenia (HCC)    2002 - well controlled    Social History   Socioeconomic History   Marital status: Single    Spouse name: Justin Sanchez   Number of children: 1   Years of education: 14   Highest education level: GED or equivalent  Occupational History   Occupation: Other    Comment: Works Lobbyist  Tobacco Use   Smoking status: Former    Packs/day: 1.50    Years: 6.00    Total pack years: 9.00    Types: Cigarettes   Smokeless tobacco: Never  Vaping Use   Vaping Use: Never used  Substance and Sexual Activity   Alcohol use: Yes    Alcohol/week: 10.0 standard drinks of alcohol    Types: 10 Cans of beer per week   Drug use: No   Sexual activity: Yes    Birth control/protection: Condom  Other Topics Concern   Not on file  Social History Narrative   Born in [place], [place]   Raised [place], [place]   Completed some college   Family brought to  [place]      Cycle and exercise, read,    Diet: eats a little bit of everything, stays away from fried food - eats lots of vegetables.    Social Determinants of Health   Financial Resource Strain: Low Risk  (02/11/2022)   Overall Financial Resource Strain (CARDIA)    Difficulty of Paying Living Expenses: Not hard at all  Food Insecurity: No Food Insecurity (02/11/2022)   Hunger Vital Sign    Worried About Running Out of Food in the Last Year: Never true    Ran Out of Food in the Last Year: Never true  Transportation Needs: No Transportation Needs (02/11/2022)   PRAPARE - Administrator, Civil Service (Medical): No    Lack of Transportation (Non-Medical): No  Physical Activity: Sufficiently Active (02/11/2022)   Exercise Vital Sign    Days of Exercise per Week: 6 days    Minutes of Exercise per Session: 150+ min  Stress: No Stress Concern Present (02/11/2022)   [employer] of Occupational Health - Occupational Stress Questionnaire    Feeling of Stress : Not at all  Social Connections: Unknown (02/11/2022)  Social Connection and Isolation Panel [NHANES]    Frequency of Communication with Friends and Family: Three times a week    Frequency of Social Gatherings with Friends and Family: Once a week    Attends Religious Services: Patient refused    Database administrator or Organizations: No    Attends Engineer, structural: Not on file    Marital Status: Divorced  Intimate Partner Violence: Not on file    No past surgical history on file.  Family History  Problem Relation Age of Onset   Prostate cancer Maternal Uncle     No Known Allergies  Current Outpatient Medications on File Prior to Visit  Medication Sig Dispense Refill   atorvastatin (LIPITOR) 80 MG tablet Take 1 tablet (80 mg total) by mouth daily. 90 tablet 3   OLANZapine (ZYPREXA) 5 MG tablet TAKE 1 TABLET BY MOUTH DAILY. 90 tablet 3   predniSONE (DELTASONE) 50 MG tablet Take 50 mg by  mouth 2 (two) times daily.     simvastatin (ZOCOR) 20 MG tablet      valACYclovir (VALTREX) 500 MG tablet TAKE 1 TABLET BY MOUTH TWICE A DAY 30 tablet 2   vitamin B-12 (CYANOCOBALAMIN) 500 MCG tablet Take 500 mcg by mouth daily.     VITAMIN D PO Take 500 Units by mouth daily.      No current facility-administered medications on file prior to visit.    BP 120/80   Pulse 70   Temp 98.5 F (36.9 C) (Oral)   Ht 5\' 7"  (1.702 m)   Wt 176 lb (79.8 kg)   SpO2 96%   BMI 27.57 kg/m       Objective:   Physical Exam Vitals and nursing note reviewed.  Constitutional:      Appearance: Normal appearance.  Musculoskeletal:        General: No swelling, tenderness, deformity or signs of injury. Normal range of motion.     Right lower leg: No edema.  Skin:    General: Skin is warm and dry.     Capillary Refill: Capillary refill takes less than 2 seconds.  Neurological:     General: No focal deficit present.     Mental Status: He is oriented to person, place, and time.  Psychiatric:        Mood and Affect: Mood normal.        Behavior: Behavior normal.        Thought Content: Thought content normal.        Judgment: Judgment normal.       Assessment & Plan:  1. Numbness of toes -Does have an established relationship with podiatry, advised to wait another week and a half or so and then if not improved can go to podiatry.  Also encouraged to go to the [facility] store for custom orthotics since he is working long hours and steel toe boots.  Follow up as needed  2. Colon cancer screening  - Ambulatory referral to Gastroenterology  , NP

## 2022-02-28 ENCOUNTER — Ambulatory Visit: Payer: 59 | Admitting: Podiatry

## 2022-02-28 DIAGNOSIS — S8490XA Injury of unspecified nerve at lower leg level, unspecified leg, initial encounter: Secondary | ICD-10-CM | POA: Diagnosis not present

## 2022-03-02 NOTE — Progress Notes (Signed)
Subjective:   Patient ID: Justin Sanchez, male   DOB: 46 y.o.   MRN: [id number]   HPI Patient presents with concerns about numbness in the right fifth toe stating he started to wear steel toe shoes and it started after that neuro   ROS      Objective:  Physical Exam  Vascular status was found to be intact with mild diminishment of feeling around the fifth digit and around the MPJ right but no appearance of injury and no history of back problems when questioned     Assessment:  Appears to be more of a neuritis with a possible neuropraxia secondary to starting safety shoes     Plan:  H&P reviewed condition and explained.  I do not recommend any aggressive treatment except for thicker socks and possible soaks and anti-inflammatories as needed and patient will be seen back to recheck

## 2022-03-07 ENCOUNTER — Ambulatory Visit: Payer: 59 | Admitting: Podiatry

## 2022-03-14 ENCOUNTER — Ambulatory Visit (INDEPENDENT_AMBULATORY_CARE_PROVIDER_SITE_OTHER): Payer: 59 | Admitting: Adult Health

## 2022-03-14 ENCOUNTER — Encounter: Payer: Self-pay | Admitting: Adult Health

## 2022-03-14 VITALS — BP 120/80 | HR 64 | Temp 98.1°F | Ht 67.0 in | Wt 170.0 lb

## 2022-03-14 DIAGNOSIS — Z125 Encounter for screening for malignant neoplasm of prostate: Secondary | ICD-10-CM | POA: Diagnosis not present

## 2022-03-14 DIAGNOSIS — E782 Mixed hyperlipidemia: Secondary | ICD-10-CM | POA: Diagnosis not present

## 2022-03-14 DIAGNOSIS — F200 Paranoid schizophrenia: Secondary | ICD-10-CM | POA: Diagnosis not present

## 2022-03-14 DIAGNOSIS — Z0000 Encounter for general adult medical examination without abnormal findings: Secondary | ICD-10-CM | POA: Diagnosis not present

## 2022-03-14 DIAGNOSIS — Z1159 Encounter for screening for other viral diseases: Secondary | ICD-10-CM | POA: Diagnosis not present

## 2022-03-14 LAB — COMPREHENSIVE METABOLIC PANEL
ALT: 30 U/L (ref 0–53)
AST: 27 U/L (ref 0–37)
Albumin: 4.5 g/dL (ref 3.5–5.2)
Alkaline Phosphatase: 50 U/L (ref 39–117)
BUN: 5 mg/dL — ABNORMAL LOW (ref 6–23)
CO2: 30 mEq/L (ref 19–32)
Calcium: 9.9 mg/dL (ref 8.4–10.5)
Chloride: 103 mEq/L (ref 96–112)
Creatinine, Ser: 1.06 mg/dL (ref 0.40–1.50)
GFR: 85.02 mL/min (ref >60.00)
Glucose, Bld: 87 mg/dL (ref 70–99)
Potassium: 5.1 mEq/L (ref 3.5–5.1)
Sodium: 140 mEq/L (ref 135–145)
Total Bilirubin: 1 mg/dL (ref 0.2–1.2)
Total Protein: 7.3 g/dL (ref 6.0–8.3)

## 2022-03-14 LAB — CBC WITH DIFFERENTIAL/PLATELET
Basophils Absolute: 0 10*3/uL (ref 0.0–0.1)
Basophils Relative: 0.5 % (ref 0.0–3.0)
Eosinophils Absolute: 0.1 10*3/uL (ref 0.0–0.7)
Eosinophils Relative: 2.5 % (ref 0.0–5.0)
HCT: 38.2 % — ABNORMAL LOW (ref 39.0–52.0)
Hemoglobin: 12.4 g/dL — ABNORMAL LOW (ref 13.0–17.0)
Lymphocytes Relative: 38 % (ref 12.0–46.0)
Lymphs Abs: 1.9 10*3/uL (ref 0.7–4.0)
MCHC: 32.5 g/dL (ref 30.0–36.0)
MCV: 82.8 fl (ref 78.0–100.0)
Monocytes Absolute: 0.4 10*3/uL (ref 0.1–1.0)
Monocytes Relative: 8.5 % (ref 3.0–12.0)
Neutro Abs: 2.6 10*3/uL (ref 1.4–7.7)
Neutrophils Relative %: 50.5 % (ref 43.0–77.0)
Platelets: 228 10*3/uL (ref 150.0–400.0)
RBC: 4.62 Mil/uL (ref 4.22–5.81)
RDW: 14 % (ref 11.5–15.5)
WBC: 5.1 10*3/uL (ref 4.0–10.5)

## 2022-03-14 LAB — LIPID PANEL
Cholesterol: 163 mg/dL (ref 0–200)
HDL: 48 mg/dL (ref >39.00)
LDL Cholesterol: 102 mg/dL — ABNORMAL HIGH (ref 0–99)
NonHDL: 115.38
Total CHOL/HDL Ratio: 3
Triglycerides: 66 mg/dL (ref 0.0–149.0)
VLDL: 13.2 mg/dL (ref 0.0–40.0)

## 2022-03-14 LAB — TSH: TSH: 1.31 u[IU]/mL (ref 0.35–5.50)

## 2022-03-14 LAB — PSA: PSA: 0.54 ng/mL (ref 0.10–4.00)

## 2022-03-14 NOTE — Progress Notes (Signed)
Subjective:    Patient ID: Justin Sanchez, male    DOB: 10-Apr-1976, 46 y.o.   MRN: [id number]  HPI Patient presents for yearly preventative medicine examination. He is a pleasant 46 year old male who  has a past medical history of Anemia, Chicken pox, Depression, Drug abuse ([place]), Hyperlipidemia, and Schizophrenia ([place]).  Schizophrenia-well-controlled with Zyprexa.  He denies delusions, hallucinations, or disorganized thinking or speech.  Hyperlipidemia-thought to be due to Zyprexa usage.  Currently prescribed Lipitor 80 mg.  He denies myalgia or fatigue Lab Results  Component Value Date   CHOL 198 01/12/2020   HDL 39 (L) 01/12/2020   LDLCALC 137 (H) 01/12/2020   TRIG 111 01/12/2020   CHOLHDL 5.1 (H) 01/12/2020    All immunizations and health maintenance protocols were reviewed with the patient and needed orders were placed. Refuses flu shot   Appropriate screening laboratory values were ordered for the patient including screening of hyperlipidemia, renal function and hepatic function.   Medication reconciliation,  past medical history, social history, problem list and allergies were reviewed in detail with the patient  Goals were established with regard to weight loss, exercise, and  diet in compliance with medications. He eats a vegan diet and exercises multiple times a week   A referral has been placed for routine colon cancer screening.  Review of Systems  Constitutional: Negative.   HENT: Negative.    Eyes: Negative.   Respiratory: Negative.    Cardiovascular: Negative.   Gastrointestinal: Negative.   Endocrine: Negative.   Genitourinary: Negative.   Musculoskeletal: Negative.   Skin: Negative.   Allergic/Immunologic: Negative.   Neurological: Negative.   Hematological: Negative.   Psychiatric/Behavioral: Negative.    All other systems reviewed and are negative.  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  Tobacco Use   Smoking status: Former    Packs/day: 1.50    Years: 6.00    Total pack years: 9.00    Types: Cigarettes   Smokeless tobacco: Never  Vaping Use   Vaping Use: Never used  Substance and Sexual Activity   Alcohol use: Yes    Alcohol/week: 10.0 standard drinks of alcohol    Types: 10 Cans of beer per week   Drug use: No   Sexual activity: Yes    Birth control/protection: Condom  Other Topics Concern   Not on file  Social History Narrative   Born in [place], [place]   Completed some college   Family brought to [place]      Cycle and exercise, read,    Diet: eats a little bit of everything, stays away from fried food - eats lots of vegetables.    Social Determinants of Health   Financial Resource Strain: Low Risk  (02/11/2022)   Overall Financial Resource Strain (CARDIA)    Difficulty of Paying Living Expenses:  Not hard at all  Food Insecurity: No Food Insecurity (02/11/2022)   Hunger Vital Sign    Worried About Running Out of Food in the Last Year: Never true    Ran Out of Food in the Last Year: Never true  Transportation Needs: No Transportation Needs (02/11/2022)   PRAPARE - Administrator, Civil Service (Medical): No    Lack of Transportation (Non-Medical): No  Physical Activity: Sufficiently Active (02/11/2022)   Exercise Vital Sign    Days of Exercise per Week: 6 days    Minutes of Exercise per Session: 150+ min  Stress: No Stress Concern Present (02/11/2022)   [employer] of Occupational  Health - Occupational Stress Questionnaire    Feeling of Stress : Not at all  Social Connections: Unknown (02/11/2022)   Social Connection and Isolation Panel [NHANES]    Frequency of Communication with Friends and Family: Three times a week    Frequency of Social Gatherings with Friends and Family: Once a week    Attends Religious Services: Patient refused    Database administrator or Organizations: No    Attends Engineer, structural: Not on file    Marital Status: Divorced  Intimate Partner Violence: Not on file    No past surgical history on file.  Family History  Problem Relation Age of Onset   Prostate cancer Maternal Uncle     No Known Allergies  Current Outpatient Medications on File Prior to Visit  Medication Sig Dispense Refill   atorvastatin (LIPITOR) 80 MG tablet Take 1 tablet (80 mg total) by mouth daily. 90 tablet 3   OLANZapine (ZYPREXA) 5 MG tablet TAKE 1 TABLET BY MOUTH DAILY. 90 tablet 3   predniSONE (DELTASONE) 50 MG tablet Take 50 mg by mouth 2 (two) times daily.     simvastatin (ZOCOR) 20 MG tablet      valACYclovir (VALTREX) 500 MG tablet TAKE 1 TABLET BY MOUTH TWICE A DAY 30 tablet 2   vitamin B-12 (CYANOCOBALAMIN) 500 MCG tablet Take 500 mcg by mouth daily.     VITAMIN D PO Take 500 Units by mouth daily.      No current facility-administered medications on file prior to visit.    There were no vitals taken for this visit.      Objective:   Physical Exam Vitals and nursing note reviewed.  Constitutional:      General: He is not in acute distress.    Appearance: Normal appearance. He is well-developed and normal weight.  HENT:     Head: Normocephalic and atraumatic.     Right Ear: Tympanic membrane, ear canal and external ear normal. There is no impacted cerumen.     Left Ear: Tympanic membrane, ear canal and external ear normal. There is no impacted cerumen.     Nose: Nose normal. No congestion or rhinorrhea.     Mouth/Throat:      Mouth: Mucous membranes are moist.     Pharynx: Oropharynx is clear. No oropharyngeal exudate or posterior oropharyngeal erythema.  Eyes:     General:        Right eye: No discharge.        Left eye: No discharge.     Extraocular Movements: Extraocular movements intact.     Conjunctiva/sclera: Conjunctivae normal.     Pupils: Pupils are equal, round, and reactive to light.  Neck:     Vascular: No carotid bruit.     Trachea: No tracheal deviation.  Cardiovascular:     Rate and Rhythm: Normal rate and regular rhythm.     Pulses: Normal pulses.     Heart sounds: Normal heart sounds. No murmur heard.    No friction rub. No gallop.  Pulmonary:     Effort: Pulmonary effort is normal. No respiratory distress.     Breath sounds: Normal breath sounds. No stridor. No wheezing, rhonchi or rales.  Chest:     Chest wall: No tenderness.  Abdominal:     General: Bowel sounds are normal. There is no distension.     Palpations: Abdomen is soft. There is no mass.     Tenderness: There is no abdominal tenderness. There is no right CVA tenderness, left CVA tenderness, guarding or rebound.     Hernia: No hernia is present.  Musculoskeletal:        General: No swelling, tenderness, deformity or signs of injury. Normal range of motion.     Right lower leg: No edema.     Left lower leg: No edema.  Lymphadenopathy:     Cervical: No cervical adenopathy.  Skin:    General: Skin is warm and dry.     Capillary Refill: Capillary refill takes less than 2 seconds.     Coloration: Skin is not jaundiced or pale.     Findings: No bruising, erythema, lesion or rash.  Neurological:     General: No focal deficit present.     Mental Status: He is alert and oriented to person, place, and time.     Cranial Nerves: No cranial nerve deficit.     Sensory: No sensory deficit.     Motor: No weakness.     Coordination: Coordination normal.     Gait: Gait normal.     Deep Tendon Reflexes: Reflexes normal.   Psychiatric:        Mood and Affect: Mood normal.        Behavior: Behavior normal.        Thought Content: Thought content normal.        Judgment: Judgment normal.       Assessment & Plan:  1. Routine general medical examination at a health care facility - Continue to exercise and eat healthy  - Follow up in one year or sooner if needed - CBC with Differential/Platelet; Future - Comprehensive metabolic panel; Future - Lipid panel; Future - TSH; Future  2. Paranoid schizophrenia (HCC) - Continue with Zyprexa  - CBC with Differential/Platelet; Future - Comprehensive metabolic panel; Future - Lipid panel; Future - TSH; Future  3. Mixed hyperlipidemia - Continue with Lipitor 80 mg  - CBC with Differential/Platelet; Future - Comprehensive metabolic panel; Future - Lipid panel; Future - TSH; Future  4. Need for hepatitis C screening test  - Hep C Antibody; Future  5. Prostate cancer screening  - PSA; Future  [name], NP

## 2022-03-14 NOTE — Patient Instructions (Signed)
It was great seeing you today   We will follow up with you regarding your lab work   Please let me know if you need anything   

## 2022-03-17 LAB — HEPATITIS C ANTIBODY: Hepatitis C Ab: NONREACTIVE

## 2022-03-19 ENCOUNTER — Encounter: Payer: Self-pay | Admitting: Gastroenterology

## 2022-04-11 ENCOUNTER — Ambulatory Visit (AMBULATORY_SURGERY_CENTER): Payer: Self-pay

## 2022-04-11 VITALS — Ht 67.0 in | Wt 170.0 lb

## 2022-04-11 DIAGNOSIS — Z1211 Encounter for screening for malignant neoplasm of colon: Secondary | ICD-10-CM

## 2022-04-11 MED ORDER — NA SULFATE-K SULFATE-MG SULF 17.5-3.13-1.6 GM/177ML PO SOLN: 1.0000 | ORAL | 0 refills | Status: DC

## 2022-04-11 NOTE — Progress Notes (Signed)

## 2022-04-26 ENCOUNTER — Encounter: Payer: Self-pay | Admitting: Certified Registered Nurse Anesthetist

## 2022-04-28 ENCOUNTER — Other Ambulatory Visit: Payer: Self-pay | Admitting: Adult Health

## 2022-04-28 DIAGNOSIS — E782 Mixed hyperlipidemia: Secondary | ICD-10-CM

## 2022-04-28 DIAGNOSIS — F200 Paranoid schizophrenia: Secondary | ICD-10-CM

## 2022-05-02 ENCOUNTER — Encounter: Payer: Self-pay | Admitting: Gastroenterology

## 2022-05-02 ENCOUNTER — Ambulatory Visit (AMBULATORY_SURGERY_CENTER): Payer: 59 | Admitting: Gastroenterology

## 2022-05-02 VITALS — BP 109/62 | HR 60 | Temp 97.5°F | Resp 13 | Ht 67.0 in | Wt 170.0 lb

## 2022-05-02 DIAGNOSIS — Z1211 Encounter for screening for malignant neoplasm of colon: Secondary | ICD-10-CM | POA: Diagnosis present

## 2022-05-02 MED ORDER — SODIUM CHLORIDE 0.9 % IV SOLN: 500.0000 mL | Freq: Once | INTRAVENOUS | Status: DC

## 2022-05-02 NOTE — Progress Notes (Signed)
Report given to PACU, vss 

## 2022-05-02 NOTE — Patient Instructions (Signed)
Resume previous diet and medications.  Repeat colonoscopy in 10 years for screening purposes.    YOU HAD AN ENDOSCOPIC PROCEDURE TODAY AT THE [facility]:   Refer to the procedure report that was given to you for any specific questions about what was found during the examination.  If the procedure report does not answer your questions, please call your gastroenterologist to clarify.  If you requested that your care partner not be given the details of your procedure findings, then the procedure report has been included in a sealed envelope for you to review at your convenience later.  YOU SHOULD EXPECT: Some feelings of bloating in the abdomen. Passage of more gas than usual.  Walking can help get rid of the air that was put into your GI tract during the procedure and reduce the bloating. If you had a lower endoscopy (such as a colonoscopy or flexible sigmoidoscopy) you may notice spotting of blood in your stool or on the toilet paper. If you underwent a bowel prep for your procedure, you may not have a normal bowel movement for a few days.  Please Note:  You might notice some irritation and congestion in your nose or some drainage.  This is from the oxygen used during your procedure.  There is no need for concern and it should clear up in a day or so.  SYMPTOMS TO REPORT IMMEDIATELY:  Following lower endoscopy (colonoscopy or flexible sigmoidoscopy):  Excessive amounts of blood in the stool  Significant tenderness or worsening of abdominal pains  Swelling of the abdomen that is new, acute  Fever of 100F or higher   For urgent or emergent issues, a gastroenterologist can be reached at any hour by calling [phone]. Do not use MyChart messaging for urgent concerns.    DIET:  We do recommend a small meal at first, but then you may proceed to your regular diet.  Drink plenty of fluids but you should avoid alcoholic beverages for 24 hours.  ACTIVITY:  You should plan to take  it easy for the rest of today and you should NOT DRIVE or use heavy machinery until tomorrow (because of the sedation medicines used during the test).    FOLLOW UP: Our staff will call the number listed on your records the next business day following your procedure.  We will call around 7:15- 8:00 am to check on you and address any questions or concerns that you may have regarding the information given to you following your procedure. If we do not reach you, we will leave a message.     If any biopsies were taken you will be contacted by phone or by letter within the next 1-3 weeks.  Please call us at [phone] if you have not heard about the biopsies in 3 weeks.    SIGNATURES/CONFIDENTIALITY: You and/or your care partner have signed paperwork which will be entered into your electronic medical record.  These signatures attest to the fact that that the information above on your After Visit Summary has been reviewed and is understood.  Full responsibility of the confidentiality of this discharge information lies with you and/or your care-partner. 

## 2022-05-02 NOTE — Progress Notes (Signed)
[name] History and Physical   Primary Care Physician:  [name], NP   Reason for Procedure:   Colon cancer screening  Plan:    colonoscopy     HPI: Justin Sanchez is a 46 y.o. male  here for colonoscopy screening    Patient denies any bowel symptoms at this time. No family history of colon cancer known. Otherwise feels well without any cardiopulmonary symptoms.   I have discussed risks / benefits of anesthesia and endoscopic procedure with [name] and they wish to proceed with the exams as outlined today.    Past Medical History:  Diagnosis Date   Anemia    Iron deficinecy - taking iron   Chicken pox    Depression    Long time ago - denies currently for 10 years   Drug abuse (H[facility])    Hyperlipidemia    Schizophrenia (HCC)    2002 - well controlled    Past Surgical History:  Procedure Laterality Date   CYST EXCISION Right    right hand    Prior to Admission medications   Medication Sig Start Date End Date Taking? Authorizing Provider  atorvastatin (LIPITOR) 80 MG tablet TAKE 1 TABLET BY MOUTH EVERY DAY 04/29/22  Yes Nafziger, Kandee Keen, NP  OLANZapine (ZYPREXA) 5 MG tablet TAKE 1 TABLET BY MOUTH EVERY DAY 04/29/22  Yes Nafziger, Kandee Keen, NP  vitamin B-12 (CYANOCOBALAMIN) 500 MCG tablet Take 500 mcg by mouth daily.   Yes [provider]  VITAMIN D PO Take 500 Units by mouth daily.    Yes [provider]  valACYclovir (VALTREX) 500 MG tablet TAKE 1 TABLET BY MOUTH TWICE A DAY Patient not taking: Reported on 04/11/2022 01/02/22   [name], NP    Current Outpatient Medications  Medication Sig Dispense Refill   atorvastatin (LIPITOR) 80 MG tablet TAKE 1 TABLET BY MOUTH EVERY DAY 90 tablet 3   OLANZapine (ZYPREXA) 5 MG tablet TAKE 1 TABLET BY MOUTH EVERY DAY 90 tablet 3   vitamin B-12 (CYANOCOBALAMIN) 500 MCG tablet Take 500 mcg by mouth daily.     VITAMIN D PO Take 500 Units by mouth daily.      valACYclovir (VALTREX) 500  MG tablet TAKE 1 TABLET BY MOUTH TWICE A DAY (Patient not taking: Reported on 04/11/2022) 30 tablet 2   Current Facility-Administered Medications  Medication Dose Route Frequency Provider Last Rate Last Admin   0.9 %  sodium chloride infusion  500 mL Intravenous Once Deyja Sochacki, Willaim Rayas, MD        Allergies as of 05/02/2022   (No Known Allergies)    Family History  Problem Relation Age of Onset   Prostate cancer Maternal Uncle     Social History   Socioeconomic History   Marital status: Single    Spouse name: Justin Sanchez   Number of children: 1   Years of education: 14   Highest education level: GED or equivalent  Occupational History   Occupation: Other    Comment: Works Lobbyist  Tobacco Use   Smoking status: Former    Packs/day: 1.50    Years: 6.00    Total pack years: 9.00    Types: Cigarettes   Smokeless tobacco: Never  Vaping Use   Vaping Use: Never used  Substance and Sexual Activity   Alcohol use: Yes    Alcohol/week: 10.0 standard drinks of alcohol    Types: 10 Cans of beer per week   Drug use: No   Sexual  activity: Yes    Birth control/protection: Condom  Other Topics Concern   Not on file  Social History Narrative   Born in [place], [place]   Raised [place], [place]   Completed some college   Family brought to [place]      Cycle and exercise, read,    Diet: eats a little bit of everything, stays away from fried food - eats lots of vegetables.    Social Determinants of Health   Financial Resource Strain: Low Risk  (02/11/2022)   Overall Financial Resource Strain (CARDIA)    Difficulty of Paying Living Expenses: Not hard at all  Food Insecurity: No Food Insecurity (02/11/2022)   Hunger Vital Sign    Worried About Running Out of Food in the Last Year: Never true    Ran Out of Food in the Last Year: Never true  Transportation Needs: No Transportation Needs (02/11/2022)   PRAPARE - Administrator, Civil Service (Medical):  No    Lack of Transportation (Non-Medical): No  Physical Activity: Sufficiently Active (02/11/2022)   Exercise Vital Sign    Days of Exercise per Week: 6 days    Minutes of Exercise per Session: 150+ min  Stress: No Stress Concern Present (02/11/2022)   [employer] of Occupational Health - Occupational Stress Questionnaire    Feeling of Stress : Not at all  Social Connections: Unknown (02/11/2022)   Social Connection and Isolation Panel [NHANES]    Frequency of Communication with Friends and Family: Three times a week    Frequency of Social Gatherings with Friends and Family: Once a week    Attends Religious Services: Patient refused    Database administrator or Organizations: No    Attends Engineer, structural: Not on file    Marital Status: Divorced  Intimate Partner Violence: Not on file    Review of Systems: All other review of systems negative except as mentioned in the HPI.  Physical Exam: Vital signs BP 108/61   Pulse (!) 58   Temp (!) 97.5 F (36.4 C) (Skin)   Ht 5\' 7"  (1.702 m)   Wt 170 lb (77.1 kg)   SpO2 97%   BMI 26.63 kg/m   General:   Alert,  Well-developed, pleasant and cooperative in NAD Lungs:  Clear throughout to auscultation.   Heart:  Regular rate and rhythm Abdomen:  Soft, nontender and nondistended.   Neuro/Psych:  Alert and cooperative. Normal mood and affect. A and O x 3  , MD [facility]

## 2022-05-02 NOTE — Op Note (Signed)
[facility] Patient Name: Justin Sanchez Procedure Date: 05/02/2022 11:37 AM MRN: [id number] Endoscopist: Viviann Spare P. Adela Lank , MD, [id number] Age: 46 Referring MD:  Date of Birth: July 05, 1975 Gender: Male Account #: [id number] Procedure:                Colonoscopy Indications:              Screening for colorectal malignant neoplasm, This                            is the patient's first colonoscopy Medicines:                Monitored Anesthesia Care Procedure:                Pre-Anesthesia Assessment:                           - Prior to the procedure, a History and Physical                            was performed, and patient medications and                            allergies were reviewed. The patient's tolerance of                            previous anesthesia was also reviewed. The risks                            and benefits of the procedure and the sedation                            options and risks were discussed with the patient.                            All questions were answered, and informed consent                            was obtained. Prior Anticoagulants: The patient has                            taken no anticoagulant or antiplatelet agents. ASA                            Grade Assessment: II - A patient with mild systemic                            disease. After reviewing the risks and benefits,                            the patient was deemed in satisfactory condition to                            undergo the procedure.  After obtaining informed consent, the colonoscope                            was passed under direct vision. Throughout the                            procedure, the patient's blood pressure, pulse, and                            oxygen saturations were monitored continuously. The                            CF [id number] #[id number] was introduced through the anus                            and advanced to  the the cecum, identified by                            appendiceal orifice and ileocecal valve. The                            colonoscopy was performed without difficulty. The                            patient tolerated the procedure well. The quality                            of the bowel preparation was good. The ileocecal                            valve, appendiceal orifice, and rectum were                            photographed. Scope In: 11:48:27 AM Scope Out: 12:02:17 PM Scope Withdrawal Time: 0 hours 12 minutes 7 seconds  Total Procedure Duration: 0 hours 13 minutes 50 seconds  Findings:                 The perianal and digital rectal examinations were                            normal.                           Internal hemorrhoids were found during                            retroflexion. The hemorrhoids were small.                           The exam was otherwise without abnormality. Complications:            No immediate complications. Estimated blood loss:                            None. Estimated Blood Loss:  Estimated blood loss: none. Impression:               - Internal hemorrhoids.                           - The examination was otherwise normal.                           - No polyps. Recommendation:           - Patient has a contact number available for                            emergencies. The signs and symptoms of potential                            delayed complications were discussed with the                            patient. Return to normal activities tomorrow.                            Written discharge instructions were provided to the                            patient.                           - Resume previous diet.                           - Continue present medications.                           - Repeat colonoscopy in 10 years for screening                            purposes. Viviann Spare P. Herberth Deharo, MD 05/02/2022 12:05:19 PM This  report has been signed electronically.

## 2022-05-02 NOTE — Progress Notes (Signed)
Pt's states no medical or surgical changes since previsit or office visit. VS assessed by C.W 

## 2022-05-05 ENCOUNTER — Telehealth: Payer: Self-pay

## 2022-05-05 NOTE — Telephone Encounter (Signed)
  Follow up Call-     05/02/2022   11:05 AM  Call back number  Post procedure Call Back phone  # [phone]  Permission to leave phone message Yes     Patient questions:  Do you have a fever, pain , or abdominal swelling? No. Pain Score  0 *  Have you tolerated food without any problems? Yes.    Have you been able to return to your normal activities? Yes.    Do you have any questions about your discharge instructions: Diet   No. Medications  No. Follow up visit  No.  Do you have questions or concerns about your Care? No.  Actions: * If pain score is 4 or above: No action needed, pain <4.

## 2022-07-16 ENCOUNTER — Other Ambulatory Visit: Payer: Self-pay | Admitting: Adult Health

## 2022-09-11 ENCOUNTER — Other Ambulatory Visit (HOSPITAL_COMMUNITY)
Admission: RE | Admit: 2022-09-11 | Discharge: 2022-09-11 | Disposition: A | Discharge status: Home / Self Care | Payer: 59 | Source: Ambulatory Visit | Attending: Adult Health | Admitting: Adult Health

## 2022-09-11 ENCOUNTER — Ambulatory Visit (INDEPENDENT_AMBULATORY_CARE_PROVIDER_SITE_OTHER): Payer: 59 | Admitting: Adult Health

## 2022-09-11 ENCOUNTER — Encounter: Payer: Self-pay | Admitting: Adult Health

## 2022-09-11 VITALS — BP 130/82 | HR 73 | Temp 98.0°F | Ht 67.0 in | Wt 172.0 lb

## 2022-09-11 DIAGNOSIS — Z113 Encounter for screening for infections with a predominantly sexual mode of transmission: Secondary | ICD-10-CM | POA: Diagnosis present

## 2022-09-11 MED ORDER — DOXYCYCLINE HYCLATE 100 MG PO CAPS: 100.0000 mg | ORAL_CAPSULE | Freq: Two times a day (BID) | ORAL | 0 refills | Status: DC

## 2022-09-11 NOTE — Progress Notes (Signed)
Subjective:    Patient ID: Justin Sanchez, male    DOB: Mar 25, 1976, 47 y.o.   MRN: [id number]  HPI  47 year old male who  has a past medical history of Anemia, Chicken pox, Depression, Drug abuse ([place]), Hyperlipidemia, and Schizophrenia ([place]).  He reports that his girlfriend tested positive for Ureaplasma.  Although technically not an STD he would like to be checked for everything to make sure that he does not have anything that he needs to worry about.  He denies any symptoms.  He is worried that they have been passing back-and-forth she has had it for roughly a year.  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  Tobacco Use   Smoking status: Former    Packs/day: 1.50    Years: 6.00    Additional pack years: 0.00    Total pack years: 9.00    Types: Cigarettes   Smokeless tobacco: Never  Vaping Use   Vaping Use: Never used  Substance and Sexual Activity   Alcohol use: Yes    Alcohol/week: 10.0 standard drinks of alcohol    Types: 10 Cans of beer per week   Drug use: No   Sexual activity: Yes    Birth control/protection: Condom  Other Topics Concern   Not on file  Social History Narrative   Born in [place], [place]   Completed some college   Family brought to [place]      Cycle and exercise, read,    Diet: eats a little bit of everything, stays away from fried food - eats lots of vegetables.     Social Determinants of Health   Financial Resource Strain: Low Risk  (02/11/2022)   Overall Financial Resource Strain (CARDIA)    Difficulty of Paying Living Expenses: Not hard at all  Food Insecurity: No Food Insecurity (02/11/2022)   Hunger Vital Sign    Worried About Running Out of Food in the Last Year: Never true    Ran Out of Food in the Last Year: Never true  Transportation Needs: No Transportation Needs (02/11/2022)   PRAPARE - Hydrologist (Medical): No    Lack of Transportation (Non-Medical): No  Physical Activity: Sufficiently Active (02/11/2022)   Exercise Vital Sign    Days of Exercise per Week: 6 days    Minutes of Exercise per Session: 150+ min  Stress: No Stress Concern Present (02/11/2022)   [place]    Feeling of Stress : Not at all  Social Connections: Unknown (02/11/2022)   Social Connection and Isolation Panel [NHANES]    Frequency of Communication with Friends and Family: Three times a week    Frequency of Social Gatherings with Friends and  Family: Once a week    Attends Religious Services: Patient declined    Active Member of Clubs or Organizations: No    Attends Music therapist: Not on file    Marital Status: Divorced  Human resources officer Violence: Not on file    Past Surgical History:  Procedure Laterality Date   CYST EXCISION Right    right hand    Family History  Problem Relation Age of Onset   Prostate cancer Maternal Uncle     No Known Allergies  Current Outpatient Medications on File Prior to Visit  Medication Sig Dispense Refill   atorvastatin (LIPITOR) 80 MG tablet TAKE 1 TABLET BY MOUTH EVERY DAY 90 tablet 3   OLANZapine (ZYPREXA) 5 MG tablet TAKE 1 TABLET BY MOUTH EVERY DAY 90 tablet 3   valACYclovir (VALTREX) 500 MG tablet TAKE 1 TABLET BY MOUTH TWICE A DAY 180 tablet 1   vitamin B-12 (CYANOCOBALAMIN) 500 MCG tablet Take 500 mcg  by mouth daily.     VITAMIN D PO Take 500 Units by mouth daily.      Current Facility-Administered Medications on File Prior to Visit  Medication Dose Route Frequency Provider Last Rate Last Admin   0.9 %  sodium chloride infusion  500 mL Intravenous Once Armbruster, Carlota Raspberry, MD        BP 130/82   Pulse 73   Temp 98 F (36.7 C) (Oral)   Ht 5\' 7"  (1.702 m)   Wt 172 lb (78 kg)   SpO2 94%   BMI 26.94 kg/m       Objective:   Physical Exam Vitals and nursing note reviewed.  Constitutional:      Appearance: Normal appearance.  Cardiovascular:     Rate and Rhythm: Normal rate and regular rhythm.     Pulses: Normal pulses.     Heart sounds: Normal heart sounds.  Pulmonary:     Effort: Pulmonary effort is normal.     Breath sounds: Normal breath sounds.  Musculoskeletal:        General: Normal range of motion.  Skin:    General: Skin is warm and dry.  Neurological:     General: No focal deficit present.     Mental Status: He is alert and oriented to person, place, and time.  Psychiatric:        Mood and Affect: Mood normal.        Behavior: Behavior normal.        Thought Content: Thought content normal.        Judgment: Judgment normal.       Assessment & Plan:  1. Screening examination for STD (sexually transmitted disease) - Will treat with doxycycline  - Urine cytology ancillary only - HIV Antibody (routine testing w rflx); Future - RPR; Future - RPR - HIV Antibody (routine testing w rflx) - Mycoplasma / Ureaplasma Culture; Future - doxycycline (VIBRAMYCIN) 100 MG capsule; Take 1 capsule (100 mg total) by mouth 2 (two) times daily.  Dispense: 14 capsule; Refill: 0    Dorothyann Peng, NP

## 2022-09-12 LAB — HIV ANTIBODY (ROUTINE TESTING W REFLEX): HIV 1&2 Ab, 4th Generation: NONREACTIVE

## 2022-09-12 LAB — RPR: RPR Ser Ql: NONREACTIVE

## 2022-09-16 ENCOUNTER — Encounter: Payer: Self-pay | Admitting: Adult Health

## 2022-09-16 LAB — URINE CYTOLOGY ANCILLARY ONLY
Candida Urine: NEGATIVE
Chlamydia: NEGATIVE
Comment: NEGATIVE
Comment: NEGATIVE
Comment: NORMAL
Neisseria Gonorrhea: NEGATIVE
Trichomonas: NEGATIVE

## 2022-09-16 NOTE — Telephone Encounter (Signed)
FYI

## 2022-09-26 ENCOUNTER — Encounter: Payer: Self-pay | Admitting: Adult Health

## 2022-10-04 ENCOUNTER — Ambulatory Visit
Admission: EM | Admit: 2022-10-04 | Discharge: 2022-10-04 | Disposition: A | Discharge status: Home / Self Care | Payer: 59 | Attending: Internal Medicine | Admitting: Internal Medicine

## 2022-10-04 ENCOUNTER — Encounter: Payer: Self-pay | Admitting: *Deleted

## 2022-10-04 DIAGNOSIS — S80811A Abrasion, right lower leg, initial encounter: Secondary | ICD-10-CM

## 2022-10-04 NOTE — Discharge Instructions (Addendum)
Daily wound dressing changes with topical antibiotic ointment for the next couple of days You can leave the wound uncovered after 48 hours.  Continue to apply topical antibiotic ointment to the wound If you notice redness, worsening pain or swelling or discharge please return to urgent care to be reevaluated.
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
## 2022-10-04 NOTE — ED Provider Notes (Signed)
[facility]    CSN: [id number] Arrival date & time: 10/04/22  1353      History   Chief Complaint Chief Complaint  Patient presents with   Extremity Laceration    HPI Justin Sanchez is a 47 y.o. male comes to the urgent care with an abrasion on the right leg.  He sustained the abrasion was shopping in [place].  His right leg got scraped by a metal door was sticking out in the aisle.  Bleeding is well-controlled.  Patient's last tetanus vaccination was in 2022.   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 Authorizing Provider  atorvastatin (LIPITOR) 80 MG tablet TAKE 1 TABLET BY MOUTH EVERY DAY 04/29/22  Yes Nafziger, Kandee Keen, NP  OLANZapine (ZYPREXA) 5 MG tablet TAKE 1 TABLET BY MOUTH EVERY DAY 04/29/22  Yes Nafziger, Kandee Keen, NP  vitamin B-12 (CYANOCOBALAMIN) 500 MCG tablet Take 500 mcg by mouth daily.   Yes [provider]  VITAMIN D PO Take 500 Units by mouth daily.    Yes [provider]  doxycycline (VIBRAMYCIN) 100 MG capsule Take 1 capsule (100 mg total) by mouth 2 (two) times daily. 09/11/22   Nafziger, Kandee Keen, NP  valACYclovir (VALTREX) 500 MG tablet TAKE 1 TABLET BY MOUTH TWICE A DAY 07/17/22   Nafziger, Kandee Keen, NP    Family History Family History  Problem Relation Age of Onset   Prostate  cancer Maternal Uncle     Social History Social History   Tobacco Use   Smoking status: Former    Packs/day: 1.50    Years: 6.00    Additional pack years: 0.00    Total pack years: 9.00    Types: Cigarettes   Smokeless tobacco: Never  Vaping Use   Vaping Use: Never used  Substance Use Topics   Alcohol use: Yes    Alcohol/week: 2.0 standard drinks of alcohol    Types: 2 Cans of beer per week   Drug use: Not Currently    Types: Marijuana    Comment: none since 2002     Allergies   Patient has no known allergies.   Review of Systems Review of Systems As per HPI  Physical Exam Triage Vital Signs ED Triage Vitals  Enc Vitals Group     BP 10/04/22 1409 (!) 142/87     Pulse Rate 10/04/22 1409 72     Resp 10/04/22 1409 17     Temp 10/04/22 1409 98.8 F (37.1 C)     Temp Source 10/04/22 1409 Oral     SpO2 10/04/22 1409 95 %     Weight --      Height --      Head  Circumference --      Peak Flow --      Pain Score 10/04/22 1415 6     Pain Loc --      Pain Edu? --      Excl. in GC? --    No data found.  Updated Vital Signs BP (!) 142/87 (BP Location: Right Arm)   Pulse 72   Temp 98.8 F (37.1 C) (Oral)   Resp 17   SpO2 95%   Visual Acuity Right Eye Distance:   Left Eye Distance:   Bilateral Distance:    Right Eye Near:   Left Eye Near:    Bilateral Near:     Physical Exam Vitals and nursing note reviewed.  Constitutional:      General: He is not in acute distress.    Appearance: He is not ill-appearing.  Cardiovascular:     Rate and Rhythm: Normal rate and regular rhythm.  Skin:    General: Skin is warm.     Comments: Linear abrasion of the right leg.  Abrasion is about 4 inches long.  Bleeding is controlled.  Neurological:     Mental Status: He is alert.      UC Treatments / Results  Labs (all labs ordered are listed, but only abnormal results are displayed) Labs Reviewed - No data to display  EKG   Radiology No results  found.  Procedures Procedures (including critical care time)  Medications Ordered in UC Medications - No data to display  Initial Impression / Assessment and Plan / UC Course  I have reviewed the triage vital signs and the nursing notes.  Pertinent labs & imaging results that were available during my care of the patient were reviewed by me and considered in my medical decision making (see chart for details).     1.  Right leg abrasion: Wound was cleaned with wound cleansing agents Topical antibiotic ointment was applied Wound care instructions was given to patient Return precautions given. Final Clinical Impressions(s) / UC Diagnoses   Final diagnoses:  Abrasion of right leg, initial encounter     Discharge Instructions      Daily wound dressing changes with topical antibiotic ointment for the next couple of days You can leave the wound uncovered after 48 hours.  Continue to apply topical antibiotic ointment to the wound If you notice redness, worsening pain or swelling or discharge please return to urgent care to be reevaluated.     ED Prescriptions   None    PDMP not reviewed this encounter.   Merrilee Jansky, MD 10/04/22 [phone]

## 2022-11-19 ENCOUNTER — Telehealth: Payer: Self-pay | Admitting: Podiatry

## 2022-11-19 ENCOUNTER — Encounter: Payer: Self-pay | Admitting: Podiatry

## 2022-11-19 ENCOUNTER — Ambulatory Visit (INDEPENDENT_AMBULATORY_CARE_PROVIDER_SITE_OTHER): Payer: 59 | Admitting: Podiatry

## 2022-11-19 ENCOUNTER — Ambulatory Visit: Payer: 59

## 2022-11-19 ENCOUNTER — Other Ambulatory Visit: Payer: Self-pay | Admitting: Podiatry

## 2022-11-19 DIAGNOSIS — M79674 Pain in right toe(s): Secondary | ICD-10-CM | POA: Diagnosis not present

## 2022-11-19 DIAGNOSIS — L84 Corns and callosities: Secondary | ICD-10-CM | POA: Diagnosis not present

## 2022-11-19 DIAGNOSIS — Z79899 Other long term (current) drug therapy: Secondary | ICD-10-CM | POA: Diagnosis not present

## 2022-11-19 DIAGNOSIS — M2041 Other hammer toe(s) (acquired), right foot: Secondary | ICD-10-CM

## 2022-11-19 DIAGNOSIS — B351 Tinea unguium: Secondary | ICD-10-CM

## 2022-11-19 MED ORDER — TERBINAFINE HCL 250 MG PO TABS: 250.0000 mg | ORAL_TABLET | Freq: Every day | ORAL | 0 refills | Status: DC

## 2022-11-19 NOTE — Telephone Encounter (Signed)
He should take 3-4 ibuprophen at a time. Should get better

## 2022-11-19 NOTE — Telephone Encounter (Signed)
Mr. Boardwine would like to have something for pain. He received a note to be out of work for 2 days, because he is unable to put his boots on.

## 2022-11-19 NOTE — Progress Notes (Signed)
Subjective:   Patient ID: Justin Sanchez, male   DOB: 47 y.o.   MRN: [id number]   HPI Patient presents with several problems with 1 being a lot of pain in the outside of his right fifth toe and also thick yellow nail disease that he finally wants to try to get better with oral medication   ROS      Objective:  Physical Exam  Neurovascular status intact muscle strength found to be adequate with patient found to have a severe rotation fifth digit right with distal lateral keratotic lesion painful when pressed making shoe gear difficult and discolored nailbeds of all toenails     Assessment:  Digital deformity with hammertoe deformity right with distal lateral excess ptotic keratotic lesion formation 1 and 2 mycotic nail infection bilateral     Plan:  H&P both conditions discussed.  For the toe I did do a sharp sterile courtesy debridement distal lateral fifth toe it did bleed some I applied Band-Aid and then will use cushions.  I did discuss the possibility for derotational arthroplasty and distal lateral exostectomy.  #2 organ to start him on Lamisil 1 a day for 90 days explaining to him risk and we did order liver function studies today  X-rays indicated there is severe rotation digit 5 right distal lateral excess ptotic lesion

## 2022-11-20 ENCOUNTER — Telehealth: Payer: Self-pay | Admitting: Podiatry

## 2022-11-20 LAB — HEPATIC FUNCTION PANEL
ALT: 30 IU/L (ref 0–44)
AST: 28 IU/L (ref 0–40)
Albumin: 4.4 g/dL (ref 4.1–5.1)
Alkaline Phosphatase: 58 IU/L (ref 44–121)
Bilirubin Total: 0.7 mg/dL (ref 0.0–1.2)
Bilirubin, Direct: 0.16 mg/dL (ref 0.00–0.40)
Total Protein: 7.1 g/dL (ref 6.0–8.5)

## 2022-11-20 MED ORDER — MELOXICAM 15 MG PO TABS: 15.0000 mg | ORAL_TABLET | Freq: Every day | ORAL | 0 refills | Status: DC

## 2022-11-20 NOTE — Telephone Encounter (Signed)
Patient called this morning.  The ibuprofen isn't working too well for him. (Took yesterday and today)  Will send in Rx meloxicam for him for one week.  Advised if pain persists, schedule f/u with Dr. Charlsie Merles.

## 2022-11-20 NOTE — Telephone Encounter (Signed)
thanks

## 2023-01-15 ENCOUNTER — Encounter (HOSPITAL_BASED_OUTPATIENT_CLINIC_OR_DEPARTMENT_OTHER): Payer: Self-pay | Admitting: Emergency Medicine

## 2023-01-15 ENCOUNTER — Emergency Department (HOSPITAL_BASED_OUTPATIENT_CLINIC_OR_DEPARTMENT_OTHER)
Admission: EM | Admit: 2023-01-15 | Discharge: 2023-01-15 | Disposition: A | Discharge status: Home / Self Care | Payer: 59 | Attending: Emergency Medicine | Admitting: Emergency Medicine

## 2023-01-15 ENCOUNTER — Other Ambulatory Visit: Payer: Self-pay

## 2023-01-15 ENCOUNTER — Emergency Department (HOSPITAL_BASED_OUTPATIENT_CLINIC_OR_DEPARTMENT_OTHER): Payer: 59 | Admitting: Radiology

## 2023-01-15 DIAGNOSIS — R000 Tachycardia, unspecified: Secondary | ICD-10-CM | POA: Diagnosis not present

## 2023-01-15 DIAGNOSIS — R42 Dizziness and giddiness: Secondary | ICD-10-CM | POA: Insufficient documentation

## 2023-01-15 DIAGNOSIS — R0789 Other chest pain: Secondary | ICD-10-CM | POA: Diagnosis not present

## 2023-01-15 DIAGNOSIS — R55 Syncope and collapse: Secondary | ICD-10-CM | POA: Diagnosis not present

## 2023-01-15 DIAGNOSIS — R0602 Shortness of breath: Secondary | ICD-10-CM | POA: Diagnosis present

## 2023-01-15 LAB — COMPREHENSIVE METABOLIC PANEL
ALT: 28 U/L (ref 0–44)
AST: 30 U/L (ref 15–41)
Albumin: 4.7 g/dL (ref 3.5–5.0)
Alkaline Phosphatase: 51 U/L (ref 38–126)
Anion gap: 8 (ref 5–15)
BUN: 15 mg/dL (ref 6–20)
CO2: 29 mmol/L (ref 22–32)
Calcium: 9.7 mg/dL (ref 8.9–10.3)
Chloride: 100 mmol/L (ref 98–111)
Creatinine, Ser: 1.22 mg/dL (ref 0.61–1.24)
GFR, Estimated: >60 mL/min (ref >60)
Glucose, Bld: 99 mg/dL (ref 70–99)
Potassium: 4.1 mmol/L (ref 3.5–5.1)
Sodium: 137 mmol/L (ref 135–145)
Total Bilirubin: 1 mg/dL (ref 0.3–1.2)
Total Protein: 7.9 g/dL (ref 6.5–8.1)

## 2023-01-15 LAB — CBC WITH DIFFERENTIAL/PLATELET
Abs Immature Granulocytes: 0.02 10*3/uL (ref 0.00–0.07)
Basophils Absolute: 0 10*3/uL (ref 0.0–0.1)
Basophils Relative: 1 %
Eosinophils Absolute: 0.1 10*3/uL (ref 0.0–0.5)
Eosinophils Relative: 2 %
HCT: 37 % — ABNORMAL LOW (ref 39.0–52.0)
Hemoglobin: 12.3 g/dL — ABNORMAL LOW (ref 13.0–17.0)
Immature Granulocytes: 0 %
Lymphocytes Relative: 28 %
Lymphs Abs: 1.9 10*3/uL (ref 0.7–4.0)
MCH: 26.4 pg (ref 26.0–34.0)
MCHC: 33.2 g/dL (ref 30.0–36.0)
MCV: 79.4 fL — ABNORMAL LOW (ref 80.0–100.0)
Monocytes Absolute: 0.6 10*3/uL (ref 0.1–1.0)
Monocytes Relative: 8 %
Neutro Abs: 4.1 10*3/uL (ref 1.7–7.7)
Neutrophils Relative %: 61 %
Platelets: 250 10*3/uL (ref 150–400)
RBC: 4.66 MIL/uL (ref 4.22–5.81)
RDW: 13.6 % (ref 11.5–15.5)
WBC: 6.7 10*3/uL (ref 4.0–10.5)
nRBC: 0 % (ref 0.0–0.2)

## 2023-01-15 LAB — TROPONIN I (HIGH SENSITIVITY)
Troponin I (High Sensitivity): 9 ng/L (ref <18)
Troponin I (High Sensitivity): 12 ng/L (ref <18)

## 2023-01-15 LAB — TSH: TSH: 1.274 u[IU]/mL (ref 0.350–4.500)

## 2023-01-15 NOTE — Discharge Instructions (Signed)
You were seen in the emergency department for acute onset of shortness of breath and rapid heart rate.  Your heart rate was normal here and you had blood work and EKG that did not show an obvious cause of your symptoms.  We have put in a referral for you to follow-up with our cardiology team.  Please also schedule a follow-up with your primary care doctor.  Return to the emergency department if any worsening or concerning symptoms.

## 2023-01-15 NOTE — ED Triage Notes (Signed)
Patient endorses shob while he was working outside at work.  Patient speaking in complete sentences in triage, in NAD. Patient has had this happen before.

## 2023-01-15 NOTE — ED Provider Notes (Signed)
[place] EM[facility] DEPARTMENT AT [facility] Provider Note   CSN: [id number] Arrival date & time: 01/15/23  1130     History  Chief Complaint  Patient presents with   Shortness of Breath    Justin Sanchez is a 47 y.o. male.  He has no significant medical history.  He said he was at work today when he experienced a sudden elevated heart rate felt lightheaded near syncopal with some chest discomfort.  [place] Spaniel it took about 30 minutes to get his heart rate back down and his symptoms improved.  He had 1 prior episode of this back in April.  He is very athletically fit does exercise cardio bike riding.  No history of cardiac disease.  He talked to his primary care doctor's office today who told him to come to the emergency department for further evaluation.  Symptoms have resolved  The history is provided by the patient.  Palpitations Palpitations quality:  Fast Onset quality:  Sudden Duration:  30 minutes Timing:  Constant Progression:  Resolved Chronicity:  Recurrent Relieved by:  Breathing exercises Worsened by:  Nothing Ineffective treatments:  None tried Associated symptoms: chest pain, dizziness and shortness of breath   Associated symptoms: no cough, no diaphoresis, no nausea and no vomiting        Home Medications Prior to Admission medications   Medication Sig Start Date End Date Taking? Authorizing Provider  atorvastatin (LIPITOR) 80 MG tablet TAKE 1 TABLET BY MOUTH EVERY DAY 04/29/22   Nafziger, Kandee Keen, NP  doxycycline (VIBRAMYCIN) 100 MG capsule Take 1 capsule (100 mg total) by mouth 2 (two) times daily. 09/11/22   Nafziger, Kandee Keen, NP  meloxicam (MOBIC) 15 MG tablet Take 1 tablet (15 mg total) by mouth daily. 11/20/22   McCaughan, Dia D, DPM  OLANZapine (ZYPREXA) 5 MG tablet TAKE 1 TABLET BY MOUTH EVERY DAY 04/29/22   Nafziger, Kandee Keen, NP  terbinafine (LAMISIL) 250 MG tablet Take 1 tablet (250 mg total) by mouth daily. 11/19/22   Lenn Sink, DPM  valACYclovir  (VALTREX) 500 MG tablet TAKE 1 TABLET BY MOUTH TWICE A DAY 07/17/22   Nafziger, Kandee Keen, NP  vitamin B-12 (CYANOCOBALAMIN) 500 MCG tablet Take 500 mcg by mouth daily.    [provider]  VITAMIN D PO Take 500 Units by mouth daily.     [provider]      Allergies    Patient has no known allergies.    Review of Systems   Review of Systems  Constitutional:  Negative for diaphoresis.  Eyes:  Negative for visual disturbance.  Respiratory:  Positive for shortness of breath. Negative for cough.   Cardiovascular:  Positive for chest pain and palpitations.  Gastrointestinal:  Negative for nausea and vomiting.  Neurological:  Positive for dizziness.    Physical Exam Updated Vital Signs BP (!) 140/100 (BP Location: Right Arm)   Pulse 77   Temp 98.4 F (36.9 C)   Resp 19   Ht 5\' 7"  (1.702 m)   Wt 78 kg   SpO2 97%   BMI 26.93 kg/m  Physical Exam Vitals and nursing note reviewed.  Constitutional:      General: He is not in acute distress.    Appearance: Normal appearance. He is well-developed.  HENT:     Head: Normocephalic and atraumatic.  Eyes:     Conjunctiva/sclera: Conjunctivae normal.  Cardiovascular:     Rate and Rhythm: Normal rate and regular rhythm.     Heart sounds: No murmur  heard. Pulmonary:     Effort: Pulmonary effort is normal. No respiratory distress.     Breath sounds: Normal breath sounds.  Abdominal:     Palpations: Abdomen is soft.     Tenderness: There is no abdominal tenderness.  Musculoskeletal:        General: No swelling.     Cervical back: Neck supple.  Skin:    General: Skin is warm and dry.     Capillary Refill: Capillary refill takes less than 2 seconds.  Neurological:     General: No focal deficit present.     Mental Status: He is alert.     Motor: No weakness.     Gait: Gait normal.     ED Results / Procedures / Treatments   Labs (all labs ordered are listed, but only abnormal results are displayed) Labs Reviewed  CBC  WITH DIFFERENTIAL/PLATELET - Abnormal; Notable for the following components:      Result Value   Hemoglobin 12.3 (*)    HCT 37.0 (*)    MCV 79.4 (*)    All other components within normal limits  COMPREHENSIVE METABOLIC PANEL  TSH  TROPONIN I (HIGH SENSITIVITY)  TROPONIN I (HIGH SENSITIVITY)    EKG EKG Interpretation Date/Time:  Thursday January 15 2023 11:47:22 EDT Ventricular Rate:  71 PR Interval:  164 QRS Duration:  78 QT Interval:  374 QTC Calculation: 406 R Axis:   20  Text Interpretation: Normal sinus rhythm Possible Left atrial enlargement Borderline ECG No previous ECGs available Confirmed by Meridee Score [phone]) on 01/15/2023 11:49:00 AM  Radiology DG Chest 2 View  Result Date: 01/15/2023 CLINICAL DATA:  shob EXAM: CHEST - 2 VIEW COMPARISON:  None Available. FINDINGS: No pleural effusion. No pneumothorax. No focal airspace opacity. Normal cardiac and mediastinal contours. No radiographically apparent displaced rib fractures. Visualized upper abdomen is unremarkable. Vertebral body heights are maintained. IMPRESSION: No focal airspace opacity Electronically Signed   By: Lorenza Cambridge M.D.   On: 01/15/2023 12:42    Procedures Procedures    Medications Ordered in ED Medications - No data to display  ED Course/ Medical Decision Making/ A&P                                 Medical Decision Making Amount and/or Complexity of Data Reviewed Labs: ordered. Radiology: ordered.   This patient complains of dizziness lightheadedness tachycardia chest discomfort shortness of breath; this involves an extensive number of treatment Options and is a complaint that carries with it a high risk of complications and morbidity. The differential includes ACS, arrhythmia, dehydration, metabolic derangement, anemia  I ordered, reviewed and interpreted labs, which included CBC normal white count stable hemoglobin, chemistries normal troponins flat TSH normal I ordered imaging studies  which included chest x-ray and I independently    visualized and interpreted imaging which showed no acute findings Previous records obtained and reviewed in epic no recent admissions Cardiac monitoring reviewed, normal sinus rhythm Social determinants considered, no significant barriers Critical Interventions: None  After the interventions stated above, I reevaluated the patient and found patient to be asymptomatic currently Admission and further testing considered, no indications for admission or further workup at this time but will refer to cardiology as likely will need some outpatient cardiac monitoring.  Return instructions discussed         Final Clinical Impression(s) / ED Diagnoses Final diagnoses:  Tachycardia  SOB (shortness  of breath)    Rx / DC Orders ED Discharge Orders     None         Terrilee Files, MD 01/15/23 1840

## 2023-01-15 NOTE — ED Notes (Signed)
 RN reviewed discharge instructions with pt. Pt verbalized understanding and had no further questions. VSS upon discharge.  

## 2023-01-23 ENCOUNTER — Encounter: Payer: Self-pay | Admitting: Adult Health

## 2023-01-23 ENCOUNTER — Ambulatory Visit: Payer: 59 | Admitting: Adult Health

## 2023-01-23 ENCOUNTER — Ambulatory Visit (INDEPENDENT_AMBULATORY_CARE_PROVIDER_SITE_OTHER): Payer: 59

## 2023-01-23 VITALS — BP 120/80 | HR 57 | Temp 98.5°F | Ht 67.0 in | Wt 174.0 lb

## 2023-01-23 DIAGNOSIS — M25511 Pain in right shoulder: Secondary | ICD-10-CM

## 2023-01-23 DIAGNOSIS — G8929 Other chronic pain: Secondary | ICD-10-CM

## 2023-01-23 NOTE — Progress Notes (Signed)
Subjective:    Patient ID: Justin Sanchez, male    DOB: [birth year], 47 y.o.   MRN: [id number]  HPI  47 year old male who  has a past medical history of Anemia, Chicken pox, Depression, Drug abuse (HCC), Hyperlipidemia, and Schizophrenia (HCC).  He presents to the office today for a " referral to have an MRI of my right shoulder".  He reports having right shoulder issues since his early 40s.  When he was in the ER he had a taken a look at and was told that he had a bone spur.  Since that time he has had pretty constant pain with certain range of motion such as raising his arm over his head or pulling or pushing motions.  Feels as though the pain has gotten slightly worse.  Pain does not interfere with ADLs  Review of Systems See HPI   Past Medical History:  Diagnosis Date   Anemia    Iron deficinecy - taking iron   Chicken pox    Depression    Long time ago - denies currently for 10 years   Drug abuse (HCC)    Hyperlipidemia    Schizophrenia (HCC)    2002 - well controlled    Social History   Socioeconomic History   Marital status: Single    Spouse name: Justin Sanchez   Number of children: 1   Years of education: 14   Highest education level: GED or equivalent  Occupational History   Occupation: Other    Comment: Works Lobbyist  Tobacco Use   Smoking status: Former    Current packs/day: 1.50    Average packs/day: 1.5 packs/day for 6.0 years (9.0 ttl pk-yrs)    Types: Cigarettes   Smokeless tobacco: Never  Vaping Use   Vaping status: Never Used  Substance and Sexual Activity   Alcohol use: Yes    Alcohol/week: 2.0 standard drinks of alcohol    Types: 2 Cans of beer per week   Drug use: Not Currently    Types: Marijuana    Comment: none since 2002   Sexual activity: Yes    Birth control/protection: Condom  Other Topics Concern   Not on file  Social History Narrative   [place]   Completed some college   Family  brought to [place]      Cycle and exercise, read,    Diet: eats a little bit of everything, stays away from fried food - eats lots of vegetables.    Social Determinants of Health   Financial Resource Strain: Low Risk  (02/11/2022)   Overall Financial Resource Strain (CARDIA)    Difficulty of Paying Living Expenses: Not hard at all  Food Insecurity: No Food Insecurity (02/11/2022)   Hunger Vital Sign    Worried About Running Out of Food in the Last Year: Never true    Ran Out of Food in the Last Year: Never true  Transportation Needs: No Transportation Needs (02/11/2022)   PRAPARE - Administrator, Civil Service (Medical): No    Lack of Transportation (Non-Medical): No  Physical Activity: Sufficiently Active (02/11/2022)   Exercise Vital Sign    Days of Exercise per Week: 6 days    Minutes of Exercise per Session: 150+ min  Stress: No Stress Concern Present (02/11/2022)   [place] [employer] - Occupational Stress Questionnaire    Feeling of Stress : Not at all  Social Connections: Unknown (02/11/2022)   Social Connection and Isolation Panel [NHANES]    Frequency of Communication with Friends and Family: Three times a week    Frequency of Social Gatherings with Friends and Family: Once a week    Attends Religious Services: Patient declined    Database administrator or Organizations: No    Attends Engineer, structural: Not on file    Marital Status: Divorced  Catering manager Violence: Not on file    Past Surgical History:  Procedure Laterality Date   CYST EXCISION Right    right hand    Family History  Problem Relation Age of Onset   Prostate cancer Maternal Uncle     No Known Allergies  Current Outpatient Medications on File Prior to Visit  Medication Sig Dispense Refill   atorvastatin (LIPITOR) 80 MG tablet TAKE 1 TABLET BY MOUTH EVERY DAY 90 tablet 3   doxycycline (VIBRAMYCIN) 100 MG capsule Take 1 capsule (100 mg total)  by mouth 2 (two) times daily. 14 capsule 0   meloxicam (MOBIC) 15 MG tablet Take 1 tablet (15 mg total) by mouth daily. 7 tablet 0   OLANZapine (ZYPREXA) 5 MG tablet TAKE 1 TABLET BY MOUTH EVERY DAY 90 tablet 3   terbinafine (LAMISIL) 250 MG tablet Take 1 tablet (250 mg total) by mouth daily. 90 tablet 0   valACYclovir (VALTREX) 500 MG tablet TAKE 1 TABLET BY MOUTH TWICE A DAY 180 tablet 1   vitamin B-12 (CYANOCOBALAMIN) 500 MCG tablet Take 500 mcg by mouth daily.     VITAMIN D PO Take 500 Units by mouth daily.      Current Facility-Administered Medications on File Prior to Visit  Medication Dose Route Frequency Provider Last Rate Last Admin   0.9 %  sodium chloride infusion  500 mL Intravenous Once Justin [place], Justin Rayas, MD        BP 120/80   Pulse (!) 57   Temp 98.5 F (36.9 C) (Oral)   Ht 5\' 7"  (1.702 m)   Wt 174 lb (78.9 kg)   SpO2 94%   BMI 27.25 kg/m       Objective:   Physical Exam Vitals and nursing note reviewed.  Constitutional:      Appearance: Normal appearance.  Cardiovascular:     Rate and Rhythm: Regular rhythm.  Musculoskeletal:     Right shoulder: No swelling, deformity or bony tenderness. Normal range of motion. Normal strength.  Skin:    General: Skin is warm and dry.  Neurological:     Mental Status: He is alert.  Psychiatric:        Mood and Affect: Mood normal.        Behavior: Behavior normal.        Thought Content: Thought content normal.        Judgment: Judgment normal.       Assessment & Plan:   1. Chronic right shoulder pain - Will start with xray and consider steroid injection  - Can consider MRI in the future  - DG Shoulder Right; Future   Justin Frees, NP

## 2023-01-30 ENCOUNTER — Encounter: Payer: Self-pay | Admitting: Adult Health

## 2023-01-30 ENCOUNTER — Ambulatory Visit: Payer: 59 | Admitting: Adult Health

## 2023-02-03 ENCOUNTER — Ambulatory Visit: Payer: 59 | Attending: Cardiology | Admitting: Cardiology

## 2023-02-03 ENCOUNTER — Ambulatory Visit (INDEPENDENT_AMBULATORY_CARE_PROVIDER_SITE_OTHER): Payer: 59

## 2023-02-03 VITALS — BP 112/72 | HR 72 | Ht 66.0 in | Wt 175.8 lb

## 2023-02-03 DIAGNOSIS — I479 Paroxysmal tachycardia, unspecified: Secondary | ICD-10-CM | POA: Diagnosis not present

## 2023-02-03 DIAGNOSIS — E782 Mixed hyperlipidemia: Secondary | ICD-10-CM | POA: Diagnosis not present

## 2023-02-03 NOTE — Patient Instructions (Signed)
Medication Instructions:   No changes  *If you need a refill on your cardiac medications before your next appointment, please call your pharmacy*   Lab Work: Not needed     Testing/Procedures:  Will be mailed to you in 3 to 7 days Your physician has recommended that you wear a holter monitor- 7 day ZIO. Holter monitors are medical devices that record the heart's electrical activity. Doctors most often use these monitors to diagnose arrhythmias. Arrhythmias are problems with the speed or rhythm of the heartbeat. The monitor is a small, portable device. You can wear one while you do your normal daily activities. This is usually used to diagnose what is causing palpitations/syncope (passing out).   Will schedule be at [address] Your physician has requested that you have an exercise tolerance test.. An exercise tolerance test is a test to check how your heart works during exercise. You will need to walk on a treadmill or for this test. An electrocardiogram (ECG) will record your heartbeat when you are at rest and when you are exercising.   Do not drink or eat foods with caffeine for 24 hours before the test. (Chocolate, coffee, tea, or energy drinks) If you use an inhaler, bring it with you to the test. Do not smoke for 4 hours before the test. Wear comfortable shoes and clothing.         Follow-Up: At [facility], you and your health needs are our priority.  As part of our continuing mission to provide you with exceptional heart care, we have created designated Provider Care Teams.  These Care Teams include your primary Cardiologist (physician) and Advanced Practice Providers (APPs -  Physician Assistants and Nurse Practitioners) who all work together to provide you with the care you need, when you need it.     Your next appointment:   7 to 8 week(s)  The format for your next appointment:   In Person  Provider:   Bryan Lemma, MD    Other  Instructions  ZIO XT- Long Term Monitor Instructions  Your physician has requested you wear a ZIO patch monitor for 7 days.  This is a single patch monitor. [employer] supplies one patch monitor per enrollment. Additional stickers are not available. Please do not apply patch if you will be having a Nuclear Stress Test,  Echocardiogram, Cardiac CT, MRI, or Chest Xray during the period you would be wearing the  monitor. The patch cannot be worn during these tests. You cannot remove and re-apply the  ZIO XT patch monitor.  Your ZIO patch monitor will be mailed 3 day USPS to your address on file. It may take 3-5 days  to receive your monitor after you have been enrolled.  Once you have received your monitor, please review the enclosed instructions. Your monitor  has already been registered assigning a specific monitor serial # to you.  Billing and Patient Assistance Program Information  We have supplied [employer] with any of your insurance information on file for billing purposes. [employer] offers a sliding scale Patient Assistance Program for patients that do not have  insurance, or whose insurance does not completely cover the cost of the ZIO monitor.  You must apply for the Patient Assistance Program to qualify for this discounted rate.  To apply, please call [employer] at [phone], select option 4, select option 2, ask to apply for  Patient Assistance Program. Meredeth Ide will ask your household income, and how many people  are  in your household. They will quote your out-of-pocket cost based on that information.  [employer] will also be able to set up a 58-month, interest-free payment plan if needed.  Applying the monitor   Shave hair from upper left chest.  Hold abrader disc by orange tab. Rub abrader in 40 strokes over the upper left chest as  indicated in your monitor instructions.  Clean area with 4 enclosed alcohol pads. Let dry.  Apply patch as indicated in monitor instructions. Patch will be  placed under collarbone on left  side of chest with arrow pointing upward.  Rub patch adhesive wings for 2 minutes. Remove white label marked "1". Remove the white  label marked "2". Rub patch adhesive wings for 2 additional minutes.  While looking in a mirror, press and release button in center of patch. A small green light will  flash 3-4 times. This will be your only indicator that the monitor has been turned on.  Do not shower for the first 24 hours. You may shower after the first 24 hours.  Press the button if you feel a symptom. You will hear a small click. Record Date, Time and  Symptom in the Patient Logbook.  When you are ready to remove the patch, follow instructions on the last 2 pages of Patient  Logbook. Stick patch monitor onto the last page of Patient Logbook.  Place Patient Logbook in the blue and white box. Use locking tab on box and tape box closed  securely. The blue and white box has prepaid postage on it. Please place it in the mailbox as  soon as possible. Your physician should have your test results approximately 7 days after the  monitor has been mailed back to [facility].  Call [facility] Customer Care at [phone] if you have questions regarding  your ZIO XT patch monitor. Call them immediately if you see an orange light blinking on your  monitor.  If your monitor falls off in less than 4 days, contact our Monitor department at [phone].  If your monitor becomes loose or falls off after 4 days call [employer] at [phone] for  suggestions on securing your monitor

## 2023-02-03 NOTE — Progress Notes (Signed)
Cardiology Office Note:  .   Date:  02/06/2023  ID:  Justin Sanchez, DOB May 08, 1976, MRN [id number] PCP: [name], NP  [facility] Providers Cardiologist:  Bryan Lemma, MD     Chief Complaint  Patient presents with   New Patient (Initial Visit)    Evaluation of rapid heart rate spells    History of Present Illness: .     Justin Sanchez is an otherwise healthy athletically fit 47 y.o. male yes with a PMH notable for hyperlipidemia who presents here for Evaluation of Episodic Tachycardia at the request of [name], NP.  Justin Sanchez was last seen at [facility] ER 8/1 for sudden elevated Heart Rate with lightheadedness and near syncope symptoms with some chest discomfort.  Lasted about 30 minutes.  Had a similar episode back in April.  Every so he is very active doing cardio exercise-rides his bike routinely.  He went to the ER based on the recommendation of his PCP.  He then followed back up with his PCP on August 9 for cardiology referral. TSH was normal.  Chemistry normal.  Troponin is normal.  White count stable.  CBC normal.  Noted sinus rhythm. EKG was normal-Rate 71 bpm. CBC showed hemoglobin of 12.3 10 days ago usually [place] 13.    Subjective  INTERVAL HISTORY Justin Sanchez presents here today for evaluation of his episodic heart racing and chest discomfort symptoms.  He really has had 2 predominant episodes with the first 1 being back in April.  Both major episodes in a couple there is minor episodes have all been associated with some vigorous exertion on his part.  It is when he was doing more than usual activity or heavy lifting at his job he would feel that his heart rate would go up faster than it normally would and he would get somewhat lightheaded and dizzy.  If the heart rate would take a little longer to than usual to get back down and then after a minute or 2 would resolve and he feel better.  He has not had any syncope or near syncope  symptoms and other than that the heart feeling unusual with a maybe a mild ache during these fluttering sensations, no real chest pain.  No associated dyspnea.  He does note his job is quite physical and occasionally has to do some strenuous lifting.  He says he is very conscientious of how much he should be drinking any dehydration quite a bit.  At baseline he is usually very active and healthy and usually rides his road bike either the road bike or stationary bike sometimes up to 20 miles but has not had long rides recently. Of note about 20 years ago he was evaluated for palpitations and irregular heartbeats but had a negative workup. Back at age 79 he did smoke cigarettes and marijuana and drank quite a bit of alcohol, but after a few years he quit and has not had any issues since.  Cardiovascular ROS: positive for - palpitations, rapid heart rate, and these episodes are only associated with exertion, not necessarily with chest pain or dyspnea and only brief dizziness. negative for - chest pain, edema, irregular heartbeat, orthopnea, paroxysmal nocturnal dyspnea, shortness of breath, or syncope or near syncope, TIA/amaurosis fugax or claudication  ROS:  Review of Systems - Negative except maybe some mild anxiety issues, but otherwise negative if not noted above.  Past Medical History:  Diagnosis Date   Anemia    Iron deficinecy -  taking iron   Chicken pox    Depression    Long time ago - denies currently for 10 years   Drug abuse (HCC)    Hyperlipidemia    Schizophrenia (HCC)    2002 - well controlled   Past Surgical History:  Procedure Laterality Date   CYST EXCISION Right    right hand   Current Meds  Medication Sig   atorvastatin (LIPITOR) 80 MG tablet TAKE 1 TABLET BY MOUTH EVERY DAY   OLANZapine (ZYPREXA) 5 MG tablet TAKE 1 TABLET BY MOUTH EVERY DAY   terbinafine (LAMISIL) 250 MG tablet Take 1 tablet (250 mg total) by mouth daily.   vitamin B-12 (CYANOCOBALAMIN) 500 MCG  tablet Take 500 mcg by mouth daily.   VITAMIN D PO Take 500 Units by mouth daily.    Current Facility-Administered Medications for the 02/03/23 encounter (Office Visit) with Marykay Lex, MD  Medication   0.9 %  sodium chloride infusion   Social & Family History:  reports that he has quit smoking. His smoking use included cigarettes. He has a 9 pack-year smoking history. He has never used smokeless tobacco. He reports current alcohol use of about 2.0 standard drinks of alcohol per week. He reports that he does not currently use drugs after having used the following drugs: Marijuana. family history includes Prostate cancer in his maternal uncle.    Objective   Studies Reviewed: Marland Kitchen   EKG Interpretation Date/Time:  Tuesday February 03 2023 14:48:38 EDT Ventricular Rate:  72 PR Interval:  176 QRS Duration:  90 QT Interval:  380 QTC Calculation: 416 R Axis:   -28  Text Interpretation: Normal sinus rhythm Minimal voltage criteria for LVH, may be normal variant ( R in aVL ) When compared with ECG of 15-Jan-2023 11:47, QRS axis Shifted left Otherwise no significant change Confirmed by Bryan Lemma ([id number]) on 02/06/2023 12:12:32 AM   No studies  Risk Assessment/Calculations:             Physical Exam:   VS:  BP 112/72   Pulse 72   Ht 5\' 6"  (1.676 m)   Wt 175 lb 12.8 oz (79.7 kg)   SpO2 98%   BMI 28.37 kg/m    Wt Readings from Last 3 Encounters:  02/03/23 175 lb 12.8 oz (79.7 kg)  01/23/23 174 lb (78.9 kg)  01/15/23 171 lb 15.3 oz (78 kg)    GEN: Well nourished, well developed in no acute distress; healthy-appearing.  Well-groomed. NECK: No JVD; No carotid bruits CARDIAC: Normal S1, S2; RRR, no murmurs, rubs, gallops RESPIRATORY:  Clear to auscultation without rales, wheezing or rhonchi ; nonlabored, good air movement. ABDOMEN: Soft, non-tender, non-distended EXTREMITIES:  No edema; No deformity      ASSESSMENT AND PLAN: .    Problem List Items Addressed This Visit        Cardiology Problems   Paroxysmal tachycardia (HCC) - Primary (Chronic)    Would like for him to wear a short Zio patch monitor, but also would like to evaluate him on a treadmill to assess the tachycardia associate with exertion.  Would hopefully have reassuring results. I continue to encourage adequate hydration.  Would like to avoid beta-blocker or other AV nodal agent.      Relevant Orders   EKG 12-Lead (Completed)   EXERCISE TOLERANCE TEST (ETT)   LONG TERM MONITOR (3-14 DAYS)   Cardiac Stress Test: Informed Consent Details: Physician/Practitioner Attestation; Transcribe to consent form and obtain patient signature  Hyperlipidemia (Chronic)    Currently on atorvastatin 80 mg daily.  Followed by PCP.      Relevant Orders   Cardiac Stress Test: Informed Consent Details: Physician/Practitioner Attestation; Transcribe to consent form and obtain patient signature        Informed Consent   Shared Decision Making/Informed Consent{ The risks [chest pain, shortness of breath, cardiac arrhythmias, dizziness, blood pressure fluctuations, myocardial infarction, stroke/transient ischemic attack, and life-threatening complications (estimated to be 1 in 10,000)], benefits (risk stratification, diagnosing coronary artery disease, treatment guidance) and alternatives of an exercise tolerance test were discussed in detail with Mr. Sill and he agrees to proceed.      Dispo: Return in about 7 weeks (around 03/24/2023) for Routine Follow-up after testing ~ 1-2 months.  Total time spent: 19 min spent with patient + 12 min spent charting = 31 min      Signed, Marykay Lex, MD, MS Bryan Lemma, M.D., M.S. Interventional Cardiologist  [facility]  Pager # [phone] Phone # [phone] [address] [place], [place] [address]

## 2023-02-03 NOTE — Progress Notes (Unsigned)
Enrolled for [employer] to mail a ZIO XT long term holter monitor to the patients address on file.  

## 2023-02-03 NOTE — Assessment & Plan Note (Addendum)
Would like for him to wear a short Zio patch monitor, but also would like to evaluate him on a treadmill to assess the tachycardia associate with exertion.  Would hopefully have reassuring results. I continue to encourage adequate hydration.  Would like to avoid beta-blocker or other AV nodal agent.

## 2023-02-04 ENCOUNTER — Encounter: Payer: Self-pay | Admitting: Adult Health

## 2023-02-05 NOTE — Telephone Encounter (Signed)
Pt notified that he did not need to fast for A1c. Pt requesting A1c due to frequent urination and sugar cravings.

## 2023-02-06 ENCOUNTER — Encounter: Payer: Self-pay | Admitting: Adult Health

## 2023-02-06 ENCOUNTER — Encounter: Payer: Self-pay | Admitting: Cardiology

## 2023-02-06 ENCOUNTER — Ambulatory Visit: Payer: 59 | Admitting: Adult Health

## 2023-02-06 VITALS — BP 108/80 | HR 80 | Temp 98.4°F | Ht 66.0 in | Wt 174.0 lb

## 2023-02-06 DIAGNOSIS — G8929 Other chronic pain: Secondary | ICD-10-CM | POA: Diagnosis not present

## 2023-02-06 DIAGNOSIS — M25511 Pain in right shoulder: Secondary | ICD-10-CM | POA: Diagnosis not present

## 2023-02-06 MED ORDER — METHYLPREDNISOLONE ACETATE 80 MG/ML IJ SUSP
80.0000 mg | Freq: Once | INTRAMUSCULAR | Status: AC
Start: 2023-02-06 — End: 2023-02-06
  Administered 2023-02-06: 80 mg via INTRA_ARTICULAR

## 2023-02-06 NOTE — Assessment & Plan Note (Signed)
Currently on atorvastatin 80 mg daily.  Followed by PCP.

## 2023-02-06 NOTE — Progress Notes (Signed)
Subjective:    Patient ID: Justin Sanchez, male    DOB: October 12, 1975, 47 y.o.   MRN: [id number]  HPI  47 year old male who presents to the close today for follow-up regarding chronic right shoulder pain.  Was seen a couple weeks ago at which time he reported having right shoulder issues since his early 53s.  Since that time he has had pretty constant pain with certain range of motion such as raising his arm over his head or pulling or pushing motions.  He had x-ray done on that day which showed moderate degenerative changes of the [facility] joint.  He returns today for a therapeutic steroid injection     Review of Systems See HPI   Past Medical History:  Diagnosis Date   Anemia    Iron deficinecy - taking iron   Chicken pox    Depression    Long time ago - denies currently for 10 years   Drug abuse (HCC)    Hyperlipidemia    Schizophrenia (HCC)    2002 - well controlled    Social History   Socioeconomic History   Marital status: Single    Spouse name: [name]   Number of children: 1   Years of education: 14   Highest education level: GED or equivalent  Occupational History   Occupation: Other    Comment: Works Lobbyist  Tobacco Use   Smoking status: Former    Current packs/day: 1.50    Average packs/day: 1.5 packs/day for 6.0 years (9.0 ttl pk-yrs)    Types: Cigarettes   Smokeless tobacco: Never  Vaping Use   Vaping status: Never Used  Substance and Sexual Activity   Alcohol use: Yes    Alcohol/week: 2.0 standard drinks of alcohol    Types: 2 Cans of beer per week   Drug use: Not Currently    Types: Marijuana    Comment: none since 2002   Sexual activity: Yes    Birth control/protection: Condom  Other Topics Concern   Not on file  Social History Narrative   Born in [place], [place]   Raised [place], [place]   Completed some college   Family brought to [place]      Cycle and exercise, read,    Diet: eats a little bit of everything, stays  away from fried food - eats lots of vegetables.    Social Determinants of Health   Financial Resource Strain: Low Risk  (02/11/2022)   Overall Financial Resource Strain (CARDIA)    Difficulty of Paying Living Expenses: Not hard at all  Food Insecurity: No Food Insecurity (02/11/2022)   Hunger Vital Sign    Worried About Running Out of Food in the Last Year: Never true    Ran Out of Food in the Last Year: Never true  Transportation Needs: No Transportation Needs (02/11/2022)   PRAPARE - Administrator, Civil Service (Medical): No    Lack of Transportation (Non-Medical): No  Physical Activity: Sufficiently Active (02/11/2022)   Exercise Vital Sign    Days of Exercise per Week: 6 days    Minutes of Exercise per Session: 150+ min  Stress: No Stress Concern Present (02/11/2022)   [employer] of Occupational Health - Occupational Stress Questionnaire    Feeling of Stress : Not at all  Social Connections: Unknown (02/11/2022)   Social Connection and Isolation Panel [NHANES]    Frequency of Communication with Friends and Family: Three times a week  Frequency of Social Gatherings with Friends and Family: Once a week    Attends Religious Services: Patient declined    Database administrator or Organizations: No    Attends Engineer, structural: Not on file    Marital Status: Divorced  Catering manager Violence: Not on file    Past Surgical History:  Procedure Laterality Date   CYST EXCISION Right    right hand    Family History  Problem Relation Age of Onset   Prostate cancer Maternal Uncle     No Known Allergies  Current Outpatient Medications on File Prior to Visit  Medication Sig Dispense Refill   atorvastatin (LIPITOR) 80 MG tablet TAKE 1 TABLET BY MOUTH EVERY DAY 90 tablet 3   doxycycline (VIBRAMYCIN) 100 MG capsule Take 1 capsule (100 mg total) by mouth 2 (two) times daily. 14 capsule 0   meloxicam (MOBIC) 15 MG tablet Take 1 tablet (15 mg total) by  mouth daily. 7 tablet 0   OLANZapine (ZYPREXA) 5 MG tablet TAKE 1 TABLET BY MOUTH EVERY DAY 90 tablet 3   terbinafine (LAMISIL) 250 MG tablet Take 1 tablet (250 mg total) by mouth daily. 90 tablet 0   valACYclovir (VALTREX) 500 MG tablet TAKE 1 TABLET BY MOUTH TWICE A DAY 180 tablet 1   vitamin B-12 (CYANOCOBALAMIN) 500 MCG tablet Take 500 mcg by mouth daily.     VITAMIN D PO Take 500 Units by mouth daily.      Current Facility-Administered Medications on File Prior to Visit  Medication Dose Route Frequency Provider Last Rate Last Admin   0.9 %  sodium chloride infusion  500 mL Intravenous Once Armbruster, Willaim Rayas, MD        BP 108/80   Pulse 80   Temp 98.4 F (36.9 C) (Oral)   Ht 5\' 6"  (1.676 m)   Wt 174 lb (78.9 kg)   SpO2 98%   BMI 28.08 kg/m       Objective:   Physical Exam Vitals and nursing note reviewed.  Constitutional:      Appearance: Normal appearance.  Cardiovascular:     Rate and Rhythm: Normal rate and regular rhythm.     Pulses: Normal pulses.     Heart sounds: Normal heart sounds.  Pulmonary:     Effort: Pulmonary effort is normal.     Breath sounds: Normal breath sounds.  Musculoskeletal:        General: Tenderness present.  Skin:    General: Skin is warm and dry.  Neurological:     Mental Status: He is alert and oriented to person, place, and time.  Psychiatric:        Mood and Affect: Mood normal.        Behavior: Behavior normal.        Thought Content: Thought content normal.        Judgment: Judgment normal.        Assessment & Plan:  1. Chronic right shoulder pain Shoulder injection Verbal consent obtained and verified. Sterile betadine prep. Furthur cleansed with alcohol. Topical analgesic spray: Ethyl chloride. Joint: right subacromial injection Approached in typical fashion with: posterior approach Completed without difficulty Meds: 3 cc lidocaine 2% no epi, 1 cc depomedrol 80mg /cc Needle:1.5 inch 25 gauge Aftercare instructions  and Red flags advised. Immediate improvement in pain noted  - methylPREDNISolone acetate (DEPO-MEDROL) injection 80 mg

## 2023-02-09 ENCOUNTER — Telehealth (HOSPITAL_COMMUNITY): Payer: Self-pay

## 2023-02-09 NOTE — Telephone Encounter (Signed)
Spoke with the patient, detailed instructions given. He stated that he would be here for his test. S.Lorielle Boehning EMTP/CCT

## 2023-02-10 ENCOUNTER — Ambulatory Visit (HOSPITAL_COMMUNITY): Payer: 59 | Attending: Cardiology

## 2023-02-10 DIAGNOSIS — I479 Paroxysmal tachycardia, unspecified: Secondary | ICD-10-CM

## 2023-02-11 DIAGNOSIS — I479 Paroxysmal tachycardia, unspecified: Secondary | ICD-10-CM

## 2023-02-11 LAB — EXERCISE TOLERANCE TEST
Angina Index: 0
Estimated workload: 17.2
Exercise duration (min): 14 min
Exercise duration (sec): 8 s
MPHR: 175 {beats}/min
Peak HR: 173 {beats}/min
Percent HR: 98 %
Rest HR: 72 {beats}/min

## 2023-02-17 ENCOUNTER — Encounter: Payer: Self-pay | Admitting: Adult Health

## 2023-02-17 ENCOUNTER — Telehealth: Payer: Self-pay | Admitting: Adult Health

## 2023-02-17 NOTE — Telephone Encounter (Signed)
Please advise 

## 2023-02-17 NOTE — Telephone Encounter (Signed)
Patient dropped off document FMLA, to be filled out by provider. Patient requested to send it back via Fax within 7-days. Document is located in providers tray at front office.Please advise at  fax [phone] .  Patient would also like to pick up [phone].

## 2023-02-18 ENCOUNTER — Encounter: Payer: Self-pay | Admitting: Adult Health

## 2023-02-18 NOTE — Telephone Encounter (Signed)
Please see mychart message for update

## 2023-02-18 NOTE — Telephone Encounter (Signed)
Pt notified via phone call for update. We are filling out PPW now to fax out.

## 2023-02-23 NOTE — Telephone Encounter (Addendum)
Pt called to F/U on requested extended FMLA paperwork that was supposed to be faxed by Friday, 02/20/23.  Pt states they never received the paperwork, and now his situation is at risk.   Pt is asking that paperwork be faxed ASAP.  Pt informed NP is OOO on Mondays.   Pt is asking if CMA or anyone else can assist?  Please advise.

## 2023-02-26 NOTE — Telephone Encounter (Signed)
FYI

## 2023-03-30 ENCOUNTER — Ambulatory Visit: Payer: 59 | Attending: Cardiology | Admitting: Cardiology

## 2023-03-30 ENCOUNTER — Encounter: Payer: Self-pay | Admitting: Cardiology

## 2023-03-30 VITALS — BP 110/74 | HR 83 | Ht 66.0 in | Wt 173.4 lb

## 2023-03-30 DIAGNOSIS — E782 Mixed hyperlipidemia: Secondary | ICD-10-CM | POA: Diagnosis not present

## 2023-03-30 DIAGNOSIS — I479 Paroxysmal tachycardia, unspecified: Secondary | ICD-10-CM

## 2023-03-30 NOTE — Patient Instructions (Signed)
Medication Instructions:  No changes  *If you need a refill on your cardiac medications before your next appointment, please call your pharmacy*   Lab Work:  Not needed   Testing/Procedures:  Not needed  Follow-Up: At [facility] you and your health needs are our priority.  As part of our continuing mission to provide you with exceptional heart care, we have created designated Provider Care Teams.  These Care Teams include your primary Cardiologist (physician) and Advanced Practice Providers (APPs -  Physician Assistants and Nurse Practitioners) who all work together to provide you with the care you need, when you need it.     Your next appointment:   As needed  The format for your next appointment:   In Person  Provider:   Bryan Lemma, MD    Other Instructions  Stay adequatly hydrated

## 2023-03-30 NOTE — Progress Notes (Unsigned)
Cardiology Office Note:  .   Date:  04/02/2023  ID:  Justin Sanchez, DOB [birth year], MRN [id number] PCP: [name], NP  [facility] Providers Cardiologist:  Bryan Lemma, MD     Chief Complaint  Patient presents with   Follow-up    Test results-GXT and Zio patch   Palpitations    No further significant episodes.    Patient Profile: .     Justin Sanchez is a  47 y.o. male  with a PMH notable for hyperlipidemia with episodic tachycardia who presents here for 4-month follow-up at the request of [name], NP.     Justin Sanchez was seen for initial consultation on 02/03/2023 in response to [facility] visit on 01/15/2023.  He presented with a sudden elevated heart rate the last about 30 minutes. Evaluated the ETT and Zio patch monitor.  Subjective   INTERVAL HPI Discussed the use of AI scribe software for clinical note transcription with the patient, who gave verbal consent to proceed.  History of Present Illness   The patient, with a history of cardiac concerns, was last seen approximately two months ago for heart rate spells. Since then, he underwent a heart monitor test and a treadmill stress test. The patient reports that all blood work came back normal, and he has not experienced any further heart rate spells. He performed exceptionally well on the treadmill stress test, walking for over fourteen minutes, indicating a low risk profile.  The patient also mentions episodes of shortness of breath, which he believes were due to dehydration on hot, muggy days. He speculates that his hydration status might have contributed to his cardiac symptoms. The patient is capable of intense exercise, as evidenced by his ability to elevate his heart rate to 173 beats per minute without triggering any abnormal rhythms.  The heart monitor results showed no abnormal rhythms, with a heart rate ranging from 46 to 145 beats per minute, averaging at 70 beats per minute. The  patient had rare premature beats, but no couplets, triplets, or other abnormal rhythms were detected. The patient did not report any further cardiac symptoms at the time of the consultation.      Cardiovascular ROS: negative  ROS:  Review of Systems - Negative except as noted above    Objective   Studies Reviewed: .       ETT 02/10/2023:    Exercise capacity was excellent. Stage 5 was reached after exercising for 14 min and 8 sec. Maximum HR of 173 bpm. MPHR 98.0%. Peak METS 17.2.   up sloping ST depression (I and V6) was noted. ST depression began during stress and ended during recovery. There were no arrhythmias during stress. There were no arrhythmias during recovery. The ECG was negative for ischemia. 14-day Zio patch MONITOR August 2024:  Patient had a min HR of 46 bpm, max HR of 147 bpm, and avg HR of 70 bpm. Predominant underlying rhythm was Sinus Rhythm. Isolated SVEs were rare (<1.0%), and no SVE Couplets or SVE Triplets were present. Isolated VEs were rare (<1.0%), and no VE Couplets  or VE Triplets were present.   Risk Assessment/Calculations:          Physical Exam:   VS:  BP 110/74 (BP Location: Left Arm, Patient Position: Sitting, Cuff Size: Large)   Pulse 83   Ht 5\' 6"  (1.676 m)   Wt 173 lb 6.4 oz (78.7 kg)   SpO2 96%   BMI 27.99 kg/m  Wt Readings from Last 3 Encounters:  03/30/23 173 lb 6.4 oz (78.7 kg)  02/06/23 174 lb (78.9 kg)  02/03/23 175 lb 12.8 oz (79.7 kg)    GEN: Well nourished, well developed in no acute distress; healthy appearing  CARDIAC: Normal S1, S2; RRR, no murmurs, rubs, gallops RESPIRATORY:  non labored, good air movement. EXTREMITIES:  No edema; No deformity     ASSESSMENT AND PLAN: .    Problem List Items Addressed This Visit       Cardiology Problems   Hyperlipidemia (Chronic)    By PCP.  On high-dose statin.  Should be due for labs rechecked soon.  Labs as of last September 2023 the LDL is 102.      Paroxysmal tachycardia  (HCC) - Primary (Chronic)    Patient previously experienced palpitations, possibly related to dehydration. Recent heart monitor showed no abnormal rhythms, no couplets or triplets, and no runs of supraventricular or atrial tachycardia. Treadmill stress test was normal with no abnormal rhythms or signs of ischemia. Patient's heart rate was able to reach 173 bpm without issue. -Advise patient to stay adequately hydrated, especially during exercise. -If palpitations occur, patient should sit down, cool off, hydrate, and apply something cold to the chest to slow heart rate. -If symptoms worsen or persist, patient should contact the office for further evaluation.         Follow-up: Return in about 1 year (around 03/29/2024) for 1 Yr Follow-up. Patient is considered an active patient for the next three years and can contact the office if any issues arise.   Total time spent: 13 min spent with patient + 12 min spent charting = 25 min     Signed, Marykay Lex, MD, MS Bryan Lemma, M.D., M.S. Interventional Cardiologist  [facility]  Pager # [phone] Phone # [phone] [address] [place], [place] [address]

## 2023-04-02 ENCOUNTER — Encounter: Payer: Self-pay | Admitting: Cardiology

## 2023-04-02 NOTE — Assessment & Plan Note (Signed)
Patient previously experienced palpitations, possibly related to dehydration. Recent heart monitor showed no abnormal rhythms, no couplets or triplets, and no runs of supraventricular or atrial tachycardia. Treadmill stress test was normal with no abnormal rhythms or signs of ischemia. Patient's heart rate was able to reach 173 bpm without issue. -Advise patient to stay adequately hydrated, especially during exercise. -If palpitations occur, patient should sit down, cool off, hydrate, and apply something cold to the chest to slow heart rate. -If symptoms worsen or persist, patient should contact the office for further evaluation.

## 2023-04-02 NOTE — Assessment & Plan Note (Signed)
By PCP.  On high-dose statin.  Should be due for labs rechecked soon.  Labs as of last September 2023 the LDL is 102.

## 2023-06-23 ENCOUNTER — Other Ambulatory Visit: Payer: Self-pay | Admitting: Adult Health

## 2023-06-23 DIAGNOSIS — E782 Mixed hyperlipidemia: Secondary | ICD-10-CM

## 2023-06-23 DIAGNOSIS — F200 Paranoid schizophrenia: Secondary | ICD-10-CM

## 2023-07-10 ENCOUNTER — Encounter: Payer: 59 | Admitting: Adult Health

## 2023-07-10 NOTE — Progress Notes (Deleted)
 Subjective:    Patient ID: Justin Sanchez, male    DOB: 01-21-1976, 48 y.o.   MRN: [id number]  HPI Patient presents for yearly preventative medicine examination. He is a 48 year old male who  has a past medical history of Anemia, Chicken pox, Depression, Drug abuse (HCC), Hyperlipidemia, and Schizophrenia (HCC).  Schizophrenia-well-controlled with Zyprexa.  He denies delusions, hallucinations, or disorganized thinking or speech.  Hyperlipidemia-thought to be due to Zyprexa usage.  Currently prescribed Lipitor 80 mg.  He denies myalgia or fatigue Lab Results  Component Value Date   CHOL 163 03/14/2022   HDL 48.00 03/14/2022   LDLCALC 102 (H) 03/14/2022   TRIG 66.0 03/14/2022   CHOLHDL 3 03/14/2022     All immunizations and health maintenance protocols were reviewed with the patient and needed orders were placed.  Appropriate screening laboratory values were ordered for the patient including screening of hyperlipidemia, renal function and hepatic function. If indicated by BPH, a PSA was ordered.  Medication reconciliation,  past medical history, social history, problem list and allergies were reviewed in detail with the patient  Goals were established with regard to weight loss, exercise, and  diet in compliance with medications Wt Readings from Last 3 Encounters:  03/30/23 173 lb 6.4 oz (78.7 kg)  02/06/23 174 lb (78.9 kg)  02/03/23 175 lb 12.8 oz (79.7 kg)   Review of Systems  Constitutional: Negative.   HENT: Negative.    Eyes: Negative.   Respiratory: Negative.    Cardiovascular: Negative.   Gastrointestinal: Negative.   Endocrine: Negative.   Genitourinary: Negative.   Musculoskeletal: Negative.   Skin: Negative.   Allergic/Immunologic: Negative.   Neurological: Negative.   Hematological: Negative.   Psychiatric/Behavioral: Negative.    All other systems reviewed and are negative.  Past Medical History:  Diagnosis Date   Anemia    Iron deficinecy - taking iron    Chicken pox    Depression    Long time ago - denies currently for 10 years   Drug abuse (HCC)    Hyperlipidemia    Schizophrenia (HCC)    2002 - well controlled    Social History   Socioeconomic History   Marital status: Single    Spouse name: [name]   Number of children: 1   Years of education: 14   Highest education level: GED or equivalent  Occupational History   Occupation: Other    Comment: Works Lobbyist  Tobacco Use   Smoking status: Former    Current packs/day: 1.50    Average packs/day: 1.5 packs/day for 6.0 years (9.0 ttl pk-yrs)    Types: Cigarettes   Smokeless tobacco: Never  Vaping Use   Vaping status: Never Used  Substance and Sexual Activity   Alcohol use: Yes    Alcohol/week: 2.0 standard drinks of alcohol    Types: 2 Cans of beer per week   Drug use: Not Currently    Types: Marijuana    Comment: none since 2002   Sexual activity: Yes    Birth control/protection: Condom  Other Topics Concern   Not on file  Social History Narrative   Born in [place], [place]   Raised [place], [place]   Completed some college   Family brought to [place]      Cycle and exercise, read,    Diet: eats a little bit of everything, stays away from fried food - eats lots of vegetables.    Social Drivers of Health  Financial Resource Strain: Low Risk  (02/11/2022)   Overall Financial Resource Strain (CARDIA)    Difficulty of Paying Living Expenses: Not hard at all  Food Insecurity: No Food Insecurity (02/11/2022)   Hunger Vital Sign    Worried About Running Out of Food in the Last Year: Never true    Ran Out of Food in the Last Year: Never true  Transportation Needs: No Transportation Needs (02/11/2022)   PRAPARE - Administrator, Civil Service (Medical): No    Lack of Transportation (Non-Medical): No  Physical Activity: Sufficiently Active (02/11/2022)   Exercise Vital Sign    Days of Exercise per Week: 6 days    Minutes of Exercise  per Session: 150+ min  Stress: No Stress Concern Present (02/11/2022)   [employer] of Occupational Health - Occupational Stress Questionnaire    Feeling of Stress : Not at all  Social Connections: Unknown (02/11/2022)   Social Connection and Isolation Panel [NHANES]    Frequency of Communication with Friends and Family: Three times a week    Frequency of Social Gatherings with Friends and Family: Once a week    Attends Religious Services: Patient declined    Database administrator or Organizations: No    Attends Engineer, structural: Not on file    Marital Status: Divorced  Catering manager Violence: Not on file    Past Surgical History:  Procedure Laterality Date   CYST EXCISION Right    right hand    Family History  Problem Relation Age of Onset   Prostate cancer Maternal Uncle     No Known Allergies  Current Outpatient Medications on File Prior to Visit  Medication Sig Dispense Refill   atorvastatin (LIPITOR) 80 MG tablet TAKE 1 TABLET BY MOUTH EVERY DAY 30 tablet 0   doxycycline (VIBRAMYCIN) 100 MG capsule Take 1 capsule (100 mg total) by mouth 2 (two) times daily. 14 capsule 0   meloxicam (MOBIC) 15 MG tablet Take 1 tablet (15 mg total) by mouth daily. 7 tablet 0   OLANZapine (ZYPREXA) 5 MG tablet TAKE 1 TABLET BY MOUTH EVERY DAY 30 tablet 0   terbinafine (LAMISIL) 250 MG tablet Take 1 tablet (250 mg total) by mouth daily. 90 tablet 0   valACYclovir (VALTREX) 500 MG tablet TAKE 1 TABLET BY MOUTH TWICE A DAY 180 tablet 1   vitamin B-12 (CYANOCOBALAMIN) 500 MCG tablet Take 500 mcg by mouth daily.     VITAMIN D PO Take 500 Units by mouth daily.      Current Facility-Administered Medications on File Prior to Visit  Medication Dose Route Frequency Provider Last Rate Last Admin   0.9 %  sodium chloride infusion  500 mL Intravenous Once Armbruster, Willaim Rayas, MD        There were no vitals taken for this visit.      Objective:   Physical Exam Vitals and  nursing note reviewed.  Constitutional:      General: He is not in acute distress.    Appearance: Normal appearance. He is not ill-appearing.  HENT:     Head: Normocephalic and atraumatic.     Right Ear: Tympanic membrane, ear canal and external ear normal. There is no impacted cerumen.     Left Ear: Tympanic membrane, ear canal and external ear normal. There is no impacted cerumen.     Nose: Nose normal. No congestion or rhinorrhea.     Mouth/Throat:     Mouth: Mucous membranes  are moist.     Pharynx: Oropharynx is clear.  Eyes:     Extraocular Movements: Extraocular movements intact.     Conjunctiva/sclera: Conjunctivae normal.     Pupils: Pupils are equal, round, and reactive to light.  Neck:     Vascular: No carotid bruit.  Cardiovascular:     Rate and Rhythm: Normal rate and regular rhythm.     Pulses: Normal pulses.     Heart sounds: No murmur heard.    No friction rub. No gallop.  Pulmonary:     Effort: Pulmonary effort is normal.     Breath sounds: Normal breath sounds.  Abdominal:     General: Abdomen is flat. Bowel sounds are normal. There is no distension.     Palpations: Abdomen is soft. There is no mass.     Tenderness: There is no abdominal tenderness. There is no guarding or rebound.     Hernia: No hernia is present.  Musculoskeletal:        General: Normal range of motion.     Cervical back: Normal range of motion and neck supple.  Lymphadenopathy:     Cervical: No cervical adenopathy.  Skin:    General: Skin is warm and dry.     Capillary Refill: Capillary refill takes less than 2 seconds.  Neurological:     General: No focal deficit present.     Mental Status: He is alert and oriented to person, place, and time.  Psychiatric:        Mood and Affect: Mood normal.        Behavior: Behavior normal.        Thought Content: Thought content normal.        Judgment: Judgment normal.           Assessment & Plan:

## 2023-07-21 ENCOUNTER — Encounter: Payer: 59 | Admitting: Adult Health

## 2023-08-05 ENCOUNTER — Encounter: Payer: 59 | Admitting: Adult Health

## 2023-08-25 ENCOUNTER — Other Ambulatory Visit: Payer: Self-pay | Admitting: Adult Health

## 2023-08-25 DIAGNOSIS — F200 Paranoid schizophrenia: Secondary | ICD-10-CM

## 2023-08-25 DIAGNOSIS — E782 Mixed hyperlipidemia: Secondary | ICD-10-CM

## 2023-08-25 MED ORDER — ATORVASTATIN CALCIUM 80 MG PO TABS
80.0000 mg | ORAL_TABLET | Freq: Every day | ORAL | 0 refills | Status: DC
Start: 2023-08-25 — End: 2023-09-18

## 2023-08-25 MED ORDER — OLANZAPINE 5 MG PO TABS
5.0000 mg | ORAL_TABLET | Freq: Every day | ORAL | 0 refills | Status: DC
Start: 2023-08-25 — End: 2023-10-29

## 2023-09-18 ENCOUNTER — Other Ambulatory Visit: Payer: Self-pay | Admitting: Adult Health

## 2023-09-18 DIAGNOSIS — E782 Mixed hyperlipidemia: Secondary | ICD-10-CM

## 2023-10-02 ENCOUNTER — Other Ambulatory Visit: Payer: Self-pay | Admitting: Adult Health

## 2023-10-02 DIAGNOSIS — E782 Mixed hyperlipidemia: Secondary | ICD-10-CM

## 2023-10-26 ENCOUNTER — Other Ambulatory Visit: Payer: Self-pay

## 2023-10-26 ENCOUNTER — Emergency Department (HOSPITAL_COMMUNITY)
Admission: EM | Admit: 2023-10-26 | Discharge: 2023-10-27 | Disposition: A | Discharge status: Home / Self Care | Attending: Emergency Medicine | Admitting: Emergency Medicine

## 2023-10-26 ENCOUNTER — Encounter (HOSPITAL_COMMUNITY): Payer: Self-pay

## 2023-10-26 ENCOUNTER — Emergency Department (HOSPITAL_COMMUNITY)

## 2023-10-26 DIAGNOSIS — R079 Chest pain, unspecified: Secondary | ICD-10-CM | POA: Insufficient documentation

## 2023-10-26 LAB — BASIC METABOLIC PANEL WITH GFR
Anion gap: 8 (ref 5–15)
BUN: 5 mg/dL — ABNORMAL LOW (ref 6–20)
CO2: 29 mmol/L (ref 22–32)
Calcium: 9.3 mg/dL (ref 8.9–10.3)
Chloride: 100 mmol/L (ref 98–111)
Creatinine, Ser: 1.05 mg/dL (ref 0.61–1.24)
GFR, Estimated: >60 mL/min (ref >60)
Glucose, Bld: 97 mg/dL (ref 70–99)
Potassium: 3.9 mmol/L (ref 3.5–5.1)
Sodium: 137 mmol/L (ref 135–145)

## 2023-10-26 LAB — CBC
HCT: 38.9 % — ABNORMAL LOW (ref 39.0–52.0)
Hemoglobin: 12.7 g/dL — ABNORMAL LOW (ref 13.0–17.0)
MCH: 26.8 pg (ref 26.0–34.0)
MCHC: 32.6 g/dL (ref 30.0–36.0)
MCV: 82.1 fL (ref 80.0–100.0)
Platelets: 229 10*3/uL (ref 150–400)
RBC: 4.74 MIL/uL (ref 4.22–5.81)
RDW: 13.5 % (ref 11.5–15.5)
WBC: 7.6 10*3/uL (ref 4.0–10.5)
nRBC: 0 % (ref 0.0–0.2)

## 2023-10-26 LAB — TROPONIN I (HIGH SENSITIVITY): Troponin I (High Sensitivity): 4 ng/L (ref <18)

## 2023-10-26 NOTE — ED Triage Notes (Signed)
 Pt arrived from home via POV c/o 2 chest pains that were sharp in nature at 2010 pt states that he also felt light headed and over heated at the tome of the 2 chest pains. Pt is not currently having chest pain.

## 2023-10-27 DIAGNOSIS — R079 Chest pain, unspecified: Secondary | ICD-10-CM | POA: Diagnosis not present

## 2023-10-27 LAB — TROPONIN I (HIGH SENSITIVITY): Troponin I (High Sensitivity): 5 ng/L (ref <18)

## 2023-10-27 NOTE — ED Provider Notes (Signed)
 [place] [facility] DE[facility]MENT AT [facility] Provider Note   CSN: [id number] Arrival date & time: 10/26/23  2120     History  Chief Complaint  Patient presents with   Chest Pain    Justin Sanchez is a 48 y.o. male.  Patient complains of chest pain.  Patient reports he had 2 episodes of chest pain yesterday.  Patient reports that he has a history of the same.  Patient has been seen by cardiology.  He reports he has had a monitor in the past.  Patient is currently pain-free.  Patient describes a sharp pain that went through his chest.  Patient states he had 2 episodes.  He has been told that his chest pain could be related to dehydration.  Patient reports he has been drinking plenty of fluids.  Patient denies any cough or congestion he has not had any fever or chills.  He denies any shortness of breath no nausea no vomiting pain does not radiate.  The history is provided by the patient. No language interpreter was used.  Chest Pain      Home Medications Prior to Admission medications   Medication Sig Start Date End Date Taking? Authorizing Provider  atorvastatin  (LIPITOR) 80 MG tablet TAKE 1 TABLET BY MOUTH EVERY DAY 10/06/23   Nafziger, Randel Buss, NP  doxycycline  (VIBRAMYCIN ) 100 MG capsule Take 1 capsule (100 mg total) by mouth 2 (two) times daily. 09/11/22   Nafziger, Randel Buss, NP  meloxicam  (MOBIC ) 15 MG tablet Take 1 tablet (15 mg total) by mouth daily. 11/20/22   McCaughan, Dia D, DPM  OLANZapine  (ZYPREXA ) 5 MG tablet Take 1 tablet (5 mg total) by mouth daily. Annual exam needed for further refills 08/25/23   Nafziger, Randel Buss, NP  terbinafine  (LAMISIL ) 250 MG tablet Take 1 tablet (250 mg total) by mouth daily. 11/19/22   Brandt Cake, DPM  valACYclovir  (VALTREX ) 500 MG tablet TAKE 1 TABLET BY MOUTH TWICE A DAY 07/17/22   Nafziger, Randel Buss, NP  vitamin B-12 (CYANOCOBALAMIN ) 500 MCG tablet Take 500 mcg by mouth daily.    [provider]  VITAMIN D  PO Take 500 Units by mouth  daily.     [provider]      Allergies    Patient has no known allergies.    Review of Systems   Review of Systems  Cardiovascular:  Positive for chest pain.  All other systems reviewed and are negative.   Physical Exam Updated Vital Signs BP (!) 141/90   Pulse 62   Temp 98.6 F (37 C) (Temporal)   Resp 18   Ht 5\' 6"  (1.676 m)   Wt 76.2 kg   SpO2 100%   BMI 27.12 kg/m  Physical Exam Vitals and nursing note reviewed.  Constitutional:      Appearance: He is well-developed.  HENT:     Head: Normocephalic.  Cardiovascular:     Rate and Rhythm: Normal rate and regular rhythm.     Heart sounds: Normal heart sounds.  Pulmonary:     Effort: Pulmonary effort is normal.     Breath sounds: Normal breath sounds.  Abdominal:     General: There is no distension.     Palpations: Abdomen is soft.  Musculoskeletal:        General: Normal range of motion.     Cervical back: Normal range of motion.  Skin:    General: Skin is warm.  Neurological:     General: No focal deficit present.  Mental Status: He is alert and oriented to person, place, and time.  Psychiatric:        Mood and Affect: Mood normal.     ED Results / Procedures / Treatments   Labs (all labs ordered are listed, but only abnormal results are displayed) Labs Reviewed  BASIC METABOLIC PANEL WITH GFR - Abnormal; Notable for the following components:      Result Value   BUN 5 (*)    All other components within normal limits  CBC - Abnormal; Notable for the following components:   Hemoglobin 12.7 (*)    HCT 38.9 (*)    All other components within normal limits  TROPONIN I (HIGH SENSITIVITY)  TROPONIN I (HIGH SENSITIVITY)    EKG EKG Interpretation Date/Time:  Monday Oct 26 2023 21:34:24 EDT Ventricular Rate:  71 PR Interval:  168 QRS Duration:  78 QT Interval:  358 QTC Calculation: 389 R Axis:   23  Text Interpretation: Normal sinus rhythm Normal ECG When compared with ECG of  03-Feb-2023 14:48, PREVIOUS ECG IS PRESENT No significant change was found Confirmed by Earma Gloss [phone]) on 10/27/2023 6:10:20 AM  Radiology DG Chest 2 View Result Date: 10/26/2023 CLINICAL DATA:  Chest pain.  Left-sided pain. EXAM: CHEST - 2 VIEW COMPARISON:  01/15/2023 FINDINGS: The cardiomediastinal contours are normal. The lungs are clear. Pulmonary vasculature is normal. No consolidation, pleural effusion, or pneumothorax. No acute osseous abnormalities are seen. IMPRESSION: Negative radiographs of the chest. Electronically Signed   By: Chadwick Colonel M.D.   On: 10/26/2023 22:05    Procedures Procedures    Medications Ordered in ED Medications - No data to display  ED Course/ Medical Decision Making/ A&P                                 Medical Decision Making Patient complains of 2 episodes of chest pain  Amount and/or Complexity of Data Reviewed Independent Historian: friend    Details: Patient is here with a friend who is supportive External Data Reviewed: notes.    Details: Cardiology notes reviewed Labs: ordered.    Details: Labs ordered reviewed and interpreted troponin is negative x 2 Radiology: ordered and independent interpretation performed. Decision-making details documented in ED Course.    Details: Chest x-ray shows no acute cardiopulmonary disease ECG/medicine tests: ordered and independent interpretation performed. Decision-making details documented in ED Course.    Details: EKG normal sinus normal EKG  Risk OTC drugs. Risk Details: Patient counseled on findings.  He is scheduled to see his primary care physician in the next week.  Patient is advised to return to the emergency department if any problems.  He has a reassuring exam and laboratory evaluation.  Patient is discharged in stable condition.           Final Clinical Impression(s) / ED Diagnoses Final diagnoses:  Nonspecific chest pain    Rx / DC Orders ED Discharge Orders      None      An After Visit Summary was printed and given to the patient.    Sandi Crosby, PA-C 10/27/23 6045    Dorenda Gandy, MD 10/27/23 [phone]

## 2023-10-29 ENCOUNTER — Encounter: Payer: Self-pay | Admitting: Adult Health

## 2023-10-29 ENCOUNTER — Ambulatory Visit (INDEPENDENT_AMBULATORY_CARE_PROVIDER_SITE_OTHER): Admitting: Adult Health

## 2023-10-29 ENCOUNTER — Ambulatory Visit: Payer: Self-pay | Admitting: Adult Health

## 2023-10-29 VITALS — BP 112/80 | HR 61 | Temp 97.8°F | Ht 66.78 in | Wt 170.0 lb

## 2023-10-29 DIAGNOSIS — R079 Chest pain, unspecified: Secondary | ICD-10-CM

## 2023-10-29 DIAGNOSIS — A609 Anogenital herpesviral infection, unspecified: Secondary | ICD-10-CM

## 2023-10-29 DIAGNOSIS — Z0001 Encounter for general adult medical examination with abnormal findings: Secondary | ICD-10-CM

## 2023-10-29 DIAGNOSIS — D649 Anemia, unspecified: Secondary | ICD-10-CM

## 2023-10-29 DIAGNOSIS — Z0000 Encounter for general adult medical examination without abnormal findings: Secondary | ICD-10-CM

## 2023-10-29 DIAGNOSIS — F200 Paranoid schizophrenia: Secondary | ICD-10-CM | POA: Diagnosis not present

## 2023-10-29 DIAGNOSIS — E782 Mixed hyperlipidemia: Secondary | ICD-10-CM | POA: Diagnosis not present

## 2023-10-29 LAB — COMPREHENSIVE METABOLIC PANEL WITH GFR
ALT: 42 U/L (ref 0–53)
AST: 30 U/L (ref 0–37)
Albumin: 4.5 g/dL (ref 3.5–5.2)
Alkaline Phosphatase: 53 U/L (ref 39–117)
BUN: 6 mg/dL (ref 6–23)
CO2: 29 meq/L (ref 19–32)
Calcium: 9.2 mg/dL (ref 8.4–10.5)
Chloride: 100 meq/L (ref 96–112)
Creatinine, Ser: 1.07 mg/dL (ref 0.40–1.50)
GFR: 83.11 mL/min (ref >60.00)
Glucose, Bld: 98 mg/dL (ref 70–99)
Potassium: 4.3 meq/L (ref 3.5–5.1)
Sodium: 138 meq/L (ref 135–145)
Total Bilirubin: 1 mg/dL (ref 0.2–1.2)
Total Protein: 7.6 g/dL (ref 6.0–8.3)

## 2023-10-29 LAB — CBC
HCT: 39.3 % (ref 39.0–52.0)
Hemoglobin: 12.9 g/dL — ABNORMAL LOW (ref 13.0–17.0)
MCHC: 32.8 g/dL (ref 30.0–36.0)
MCV: 80.3 fl (ref 78.0–100.0)
Platelets: 234 10*3/uL (ref 150.0–400.0)
RBC: 4.9 Mil/uL (ref 4.22–5.81)
RDW: 14.1 % (ref 11.5–15.5)
WBC: 5.6 10*3/uL (ref 4.0–10.5)

## 2023-10-29 LAB — LIPID PANEL
Cholesterol: 227 mg/dL — ABNORMAL HIGH (ref 0–200)
HDL: 43 mg/dL (ref >39.00)
LDL Cholesterol: 165 mg/dL — ABNORMAL HIGH (ref 0–99)
NonHDL: 184.46
Total CHOL/HDL Ratio: 5
Triglycerides: 98 mg/dL (ref 0.0–149.0)
VLDL: 19.6 mg/dL (ref 0.0–40.0)

## 2023-10-29 LAB — IBC + FERRITIN
Ferritin: 74.6 ng/mL (ref 22.0–322.0)
Iron: 95 ug/dL (ref 42–165)
Saturation Ratios: 30.6 % (ref 20.0–50.0)
TIBC: 310.8 ug/dL (ref 250.0–450.0)
Transferrin: 222 mg/dL (ref 212.0–360.0)

## 2023-10-29 LAB — HEMOGLOBIN A1C: Hgb A1c MFr Bld: 6 % (ref 4.6–6.5)

## 2023-10-29 LAB — TSH: TSH: 1.93 u[IU]/mL (ref 0.35–5.50)

## 2023-10-29 LAB — VITAMIN B12: Vitamin B-12: 754 pg/mL (ref 211–911)

## 2023-10-29 MED ORDER — OLANZAPINE 5 MG PO TABS
5.0000 mg | ORAL_TABLET | Freq: Every day | ORAL | 1 refills | Status: AC
Start: 2023-10-29 — End: ?

## 2023-10-29 MED ORDER — VALACYCLOVIR HCL 500 MG PO TABS
500.0000 mg | ORAL_TABLET | Freq: Two times a day (BID) | ORAL | 1 refills | Status: DC
Start: 2023-10-29 — End: 2024-03-18

## 2023-10-29 NOTE — Progress Notes (Signed)
 Subjective:    Patient ID: Justin Sanchez, male    DOB: [birth year], 48 y.o.   MRN: [id number]  HPI Patient presents for yearly preventative medicine examination. He is a pleasant 48 year old male who  has a past medical history of Anemia, Chicken pox, Depression, Drug abuse (HCC), Hyperlipidemia, and Schizophrenia (HCC).  Schizophrenia-well-controlled with Zyprexa .  He denies delusions, hallucinations, or disorganized thinking or speech.  Hyperlipidemia-thought to be due to Zyprexa  usage.  Currently prescribed Lipitor 80 mg.  He denies myalgia or fatigue Lab Results  Component Value Date   CHOL 163 03/14/2022   HDL 48.00 03/14/2022   LDLCALC 102 (H) 03/14/2022   TRIG 66.0 03/14/2022   CHOLHDL 3 03/14/2022   Chest Pain -he was seen in the emergency room three days ago with the complaint of chest pain.  The day prior he reported he had 2 episodes of chest pain that were sharp in nature.  He denied any other symptoms such as shortness of breath, nausea, vomiting, pain that radiated, fevers or chills.  His EKG showed normal sinus rhythm with a ventricular rate of 71.  Chest x-ray showed no active cardiopulmonary disease.  His labs including CBC, BMP and troponin x 2 were unremarkable except for mildly decreased hemoglobin of 12.7. He does have a history of Paroysmal Tachycardia and has been seen by Cardiology in the past, last being 10/24. He wore a cardiac monitor and ETT in 02/2023 which was reassuring. He has not had any chest pain since the ER visit.   HSV - uses Valtrex  as needed   All immunizations and health maintenance protocols were reviewed with the patient and needed orders were placed.  Appropriate screening laboratory values were ordered for the patient including screening of hyperlipidemia, renal function and hepatic function.  Medication reconciliation,  past medical history, social history, problem list and allergies were reviewed in detail with the patient  Goals were  established with regard to weight loss, exercise, and  diet in compliance with medications. He continues to eat healthy and stay active.   Wt Readings from Last 3 Encounters:  10/29/23 170 lb (77.1 kg)  10/26/23 168 lb (76.2 kg)  03/30/23 173 lb 6.4 oz (78.7 kg)   He is up to date on routine colon cancer screening    Review of Systems  Constitutional: Negative.   HENT: Negative.    Eyes: Negative.   Respiratory: Negative.    Cardiovascular:  Positive for chest pain.  Gastrointestinal: Negative.   Endocrine: Negative.   Genitourinary: Negative.   Musculoskeletal: Negative.   Skin: Negative.   Allergic/Immunologic: Negative.   Neurological: Negative.   Hematological: Negative.   Psychiatric/Behavioral: Negative.    All other systems reviewed and are negative.  Past Medical History:  Diagnosis Date   Anemia    Iron deficinecy - taking iron   Chicken pox    Depression    Long time ago - denies currently for 10 years   Drug abuse (HCC)    Hyperlipidemia    Schizophrenia (HCC)    2002 - well controlled    Social History   Socioeconomic History   Marital status: Single    Spouse name: [name]   Number of children: 1   Years of education: 14   Highest education level: GED or equivalent  Occupational History   Occupation: Other    Comment: Works Lobbyist  Tobacco Use   Smoking status: Former    Current packs/day: 1.50  Average packs/day: 1.5 packs/day for 6.0 years (9.0 ttl pk-yrs)    Types: Cigarettes   Smokeless tobacco: Never  Vaping Use   Vaping status: Never Used  Substance and Sexual Activity   Alcohol use: Yes    Alcohol/week: 2.0 standard drinks of alcohol    Types: 2 Cans of beer per week   Drug use: Not Currently    Types: Marijuana    Comment: none since 2002   Sexual activity: Yes    Birth control/protection: Condom  Other Topics Concern   Not on file  Social History Narrative   Born in [place], [place]   Raised [place], [place]    Completed some college   Family brought to [place]       Cycle and exercise, read,    Diet: eats a little bit of everything, stays away from fried food - eats lots of vegetables.    Social Drivers of Corporate investment banker Strain: Low Risk  (02/11/2022)   Overall Financial Resource Strain (CARDIA)    Difficulty of Paying Living Expenses: Not hard at all  Food Insecurity: No Food Insecurity (02/11/2022)   Hunger Vital Sign    Worried About Running Out of Food in the Last Year: Never true    Ran Out of Food in the Last Year: Never true  Transportation Needs: No Transportation Needs (02/11/2022)   PRAPARE - Administrator, Civil Service (Medical): No    Lack of Transportation (Non-Medical): No  Physical Activity: Sufficiently Active (02/11/2022)   Exercise Vital Sign    Days of Exercise per Week: 6 days    Minutes of Exercise per Session: 150+ min  Stress: No Stress Concern Present (02/11/2022)   [employer] of Occupational Health - Occupational Stress Questionnaire    Feeling of Stress : Not at all  Social Connections: Unknown (02/11/2022)   Social Connection and Isolation Panel [NHANES]    Frequency of Communication with Friends and Family: Three times a week    Frequency of Social Gatherings with Friends and Family: Once a week    Attends Religious Services: Patient declined    Database administrator or Organizations: No    Attends Engineer, structural: Not on file    Marital Status: Divorced  Catering manager Violence: Not on file    Past Surgical History:  Procedure Laterality Date   CYST EXCISION Right    right hand    Family History  Problem Relation Age of Onset   Prostate cancer Maternal Uncle     No Known Allergies  Current Outpatient Medications on File Prior to Visit  Medication Sig Dispense Refill   atorvastatin  (LIPITOR) 80 MG tablet TAKE 1 TABLET BY MOUTH EVERY DAY 90 tablet 1   vitamin B-12 (CYANOCOBALAMIN ) 500 MCG  tablet Take 500 mcg by mouth daily.     VITAMIN D  PO Take 500 Units by mouth daily.      Current Facility-Administered Medications on File Prior to Visit  Medication Dose Route Frequency Provider Last Rate Last Admin   0.9 %  sodium chloride  infusion  500 mL Intravenous Once Armbruster, Lendon Queen, MD        BP 112/80   Pulse 61   Temp 97.8 F (36.6 C) (Oral)   Ht 5' 6.78" (1.696 m)   Wt 170 lb (77.1 kg)   SpO2 97%   BMI 26.80 kg/m       Objective:   Physical Exam Vitals and nursing  note reviewed.  Constitutional:      General: He is not in acute distress.    Appearance: Normal appearance. He is not ill-appearing.  HENT:     Head: Normocephalic and atraumatic.     Right Ear: Tympanic membrane, ear canal and external ear normal. There is no impacted cerumen.     Left Ear: Tympanic membrane, ear canal and external ear normal. There is no impacted cerumen.     Nose: Nose normal. No congestion or rhinorrhea.     Mouth/Throat:     Mouth: Mucous membranes are moist.     Pharynx: Oropharynx is clear.  Eyes:     Extraocular Movements: Extraocular movements intact.     Conjunctiva/sclera: Conjunctivae normal.     Pupils: Pupils are equal, round, and reactive to light.  Neck:     Vascular: No carotid bruit.  Cardiovascular:     Rate and Rhythm: Normal rate and regular rhythm.     Pulses: Normal pulses.     Heart sounds: No murmur heard.    No friction rub. No gallop.  Pulmonary:     Effort: Pulmonary effort is normal.     Breath sounds: Normal breath sounds.  Abdominal:     General: Abdomen is flat. Bowel sounds are normal. There is no distension.     Palpations: Abdomen is soft. There is no mass.     Tenderness: There is no abdominal tenderness. There is no guarding or rebound.     Hernia: No hernia is present.  Musculoskeletal:        General: Normal range of motion.     Cervical back: Normal range of motion and neck supple.  Lymphadenopathy:     Cervical: No cervical  adenopathy.  Skin:    General: Skin is warm and dry.     Capillary Refill: Capillary refill takes less than 2 seconds.  Neurological:     General: No focal deficit present.     Mental Status: He is alert and oriented to person, place, and time.  Psychiatric:        Mood and Affect: Mood normal.        Behavior: Behavior normal.        Thought Content: Thought content normal.        Judgment: Judgment normal.        Assessment & Plan:  1. Routine general medical examination at a health care facility (Primary) Today patient counseled on age appropriate routine health concerns for screening and prevention, each reviewed and up to date or declined. Immunizations reviewed and up to date or declined. Labs ordered and reviewed. Risk factors for depression reviewed and negative. Hearing function and visual acuity are intact. ADLs screened and addressed as needed. Functional ability and level of safety reviewed and appropriate. Education, counseling and referrals performed based on assessed risks today. Patient provided with a copy of personalized plan for preventive services. - Continue to eat healthy and exercise - Follow up in one year or sooner if needed  2. Paranoid schizophrenia (HCC)  - Lipid panel; Future - OLANZapine  (ZYPREXA ) 5 MG tablet; Take 1 tablet (5 mg total) by mouth daily. Annual exam needed for further refills  Dispense: 90 tablet; Refill: 1 - Lipid panel  3. Mixed hyperlipidemia - Continue statin. Consider adding Zetia  - Lipid panel; Future - TSH; Future - CBC; Future - Comprehensive metabolic panel with GFR; Future - Hemoglobin A1c; Future - Vitamin B12; Future - IBC + Ferritin; Future -  IBC + Ferritin - Vitamin B12 - Hemoglobin A1c - Comprehensive metabolic panel with GFR - CBC - TSH - Lipid panel  4. Chest pain, unspecified type - He plans to follow up with Cardiology  - Lipid panel; Future - TSH; Future - CBC; Future - Comprehensive metabolic panel  with GFR; Future - Hemoglobin A1c; Future - Vitamin B12; Future - IBC + Ferritin; Future - IBC + Ferritin - Vitamin B12 - Hemoglobin A1c - Comprehensive metabolic panel with GFR - CBC - TSH - Lipid panel  5. Anemia, unspecified type  - CBC; Future - Vitamin B12; Future - IBC + Ferritin; Future - IBC + Ferritin - Vitamin B12 - CBC  6. HSV (herpes simplex virus) anogenital infection  - valACYclovir  (VALTREX ) 500 MG tablet; Take 1 tablet (500 mg total) by mouth 2 (two) times daily.  Dispense: 180 tablet; Refill: 1  Mane Consolo, NP

## 2023-10-29 NOTE — Patient Instructions (Signed)
 It was great seeing you today   We will follow up with you regarding your lab work   Please let me know if you need anything

## 2023-10-30 ENCOUNTER — Encounter: Payer: Self-pay | Admitting: Adult Health

## 2023-10-30 NOTE — Telephone Encounter (Signed)
 Spoke to pt and he stated he Googled the meal plan and he see a lot of tofu. Pt stated its very limited and him being a vegetarian is already limited. Pt wants to know if it will be ok to just eat poultry and fish.

## 2023-11-02 ENCOUNTER — Encounter: Payer: Self-pay | Admitting: Adult Health

## 2023-11-02 DIAGNOSIS — D649 Anemia, unspecified: Secondary | ICD-10-CM

## 2023-11-02 DIAGNOSIS — E782 Mixed hyperlipidemia: Secondary | ICD-10-CM

## 2023-11-03 NOTE — Telephone Encounter (Signed)
 Ok for referral?

## 2023-11-04 ENCOUNTER — Encounter: Payer: Self-pay | Admitting: Adult Health

## 2023-11-05 ENCOUNTER — Other Ambulatory Visit: Payer: Self-pay

## 2023-11-05 MED ORDER — ACCU-CHEK SOFTCLIX LANCETS MISC: 1 refills | Status: DC

## 2023-11-05 MED ORDER — ACCU-CHEK GUIDE W/DEVICE KIT: PACK | 0 refills | Status: AC

## 2023-11-05 MED ORDER — ACCU-CHEK GUIDE TEST VI STRP: ORAL_STRIP | 1 refills | Status: DC

## 2023-11-05 NOTE — Telephone Encounter (Signed)
This has been taking care of.

## 2023-11-09 ENCOUNTER — Encounter: Payer: Self-pay | Admitting: Adult Health

## 2023-11-10 ENCOUNTER — Other Ambulatory Visit: Payer: Self-pay | Admitting: Adult Health

## 2023-11-11 NOTE — Telephone Encounter (Signed)
 Spoke to pt and advised that strips were already sent in. Pt stated that the pharmacy advised that insurance will not cover this. Pt informed me that a PA will be needed. I advised pt that insurance may not pay since he is not a diabetic. Pt also stated that he saw them OTC for $13 I advised pt to pay out-of-pocket bc it will be cheaper. Pt verbalized understanding.

## 2023-11-21 ENCOUNTER — Other Ambulatory Visit: Payer: Self-pay | Admitting: Medical Genetics

## 2023-11-21 ENCOUNTER — Encounter: Payer: Self-pay | Admitting: Adult Health

## 2023-11-24 ENCOUNTER — Other Ambulatory Visit (HOSPITAL_COMMUNITY)
Admission: RE | Admit: 2023-11-24 | Discharge: 2023-11-24 | Disposition: A | Discharge status: Home / Self Care | Payer: Self-pay | Source: Ambulatory Visit | Attending: Medical Genetics | Admitting: Medical Genetics

## 2023-12-02 LAB — GENECONNECT MOLECULAR SCREEN: Genetic Analysis Overall Interpretation: NEGATIVE

## 2024-03-17 ENCOUNTER — Ambulatory Visit: Attending: Nurse Practitioner | Admitting: Nurse Practitioner

## 2024-03-17 ENCOUNTER — Encounter: Payer: Self-pay | Admitting: Nurse Practitioner

## 2024-03-17 VITALS — BP 110/60 | HR 68 | Ht 66.5 in | Wt 179.0 lb

## 2024-03-17 DIAGNOSIS — E782 Mixed hyperlipidemia: Secondary | ICD-10-CM

## 2024-03-17 DIAGNOSIS — I479 Paroxysmal tachycardia, unspecified: Secondary | ICD-10-CM | POA: Diagnosis not present

## 2024-03-17 DIAGNOSIS — R072 Precordial pain: Secondary | ICD-10-CM

## 2024-03-17 DIAGNOSIS — R002 Palpitations: Secondary | ICD-10-CM | POA: Diagnosis not present

## 2024-03-17 MED ORDER — METOPROLOL TARTRATE 100 MG PO TABS: ORAL_TABLET | ORAL | 0 refills | Status: DC

## 2024-03-17 NOTE — Progress Notes (Unsigned)
 Office Visit    Patient Name: Justin Sanchez Date of Encounter: 03/17/2024  Primary Care Provider:  Merna Huxley, NP Primary Cardiologist:  Alm Clay, MD  Chief Complaint    48 year old male with a history of paroxysmal tachycardia, hyperlipidemia, iron deficiency anemia, and schizophrenia who presents for follow-up related to tachycardia.  Past Medical History    Past Medical History:  Diagnosis Date   Anemia    Iron deficinecy - taking iron   Chicken pox    Depression    Long time ago - denies currently for 10 years   Drug abuse (HCC)    Hyperlipidemia    Schizophrenia (HCC)    2002 - well controlled   Past Surgical History:  Procedure Laterality Date   CYST EXCISION Right    right hand    Allergies  No Known Allergies   Labs/Other Studies Reviewed    The following studies were reviewed today:  Cardiac Studies & Procedures   ______________________________________________________________________________________________   STRESS TESTS  EXERCISE TOLERANCE TEST (ETT) 02/10/2023  Interpretation Summary   Exercise capacity was excellent. Stage 5 was reached after exercising for 14 min and 8 sec. Maximum HR of 173 bpm. MPHR 98.0%. Peak METS 17.2.   up sloping ST depression (I and V6) was noted. ST depression began during stress and ended during recovery. There were no arrhythmias during stress. There were no arrhythmias during recovery. The ECG was negative for ischemia.   Prior study not available for comparison.      MONITORS  LONG TERM MONITOR (3-14 DAYS) 02/24/2023  Narrative   7-day Zio patch monitor (8/28-02/18/2023)   The patient was in sinus rhythm the entire time with a rate range of 46 to 147 bpm and an average of 70 bpm.   Rare (1%) PACs and PVCs noted.  No couplets or triplets.  No bigeminy or trigeminy   No Sustained Arrhythmias: Atrial Tachycardia (AT), Supraventricular Tachycardia (SVT), Atrial Fibrillation (A-Fib), Atrial Flutter  (A-Flutter), Sustained Ventricular Tachycardia (VT)   No sinus pauses, significant bradycardia, or AV block noted.  Bradycardia was during hours of sleep.  Overall pretty normal study.  Nothing abnormal noted.  Alm Clay, MD       ______________________________________________________________________________________________     Recent Labs: 10/29/2023: ALT 42; BUN 6; Creatinine, Ser 1.07; Hemoglobin 12.9; Platelets 234.0; Potassium 4.3; Sodium 138; TSH 1.93  Recent Lipid Panel    Component Value Date/Time   CHOL 227 (H) 10/29/2023 0835   TRIG 98.0 10/29/2023 0835   HDL 43.00 10/29/2023 0835   CHOLHDL 5 10/29/2023 0835   VLDL 19.6 10/29/2023 0835   LDLCALC 165 (H) 10/29/2023 0835   LDLCALC 137 (H) 01/12/2020 1419    History of Present Illness    48 year old male with the above past medical history including paroxysmal tachycardia, hyperlipidemia, iron deficiency anemia, and schizophrenia.  He was referred to cardiology in 01/2023 in the setting of tachycardia.  He was evaluated in the [facility] on 01/15/2023 in the setting of elevated heart rate, with associated lightheadedness, near syncope.  ETT in 01/2023 was normal. Cardiac monitor in 02/2023 showed predominantly normal sinus rhythm, rare PACs and PVCs, no significant or sustained arrhythmia.  He was last seen in the office on 03/30/2023 and was stable from a cardiac standpoint.  Adequate hydration was encouraged.  He was evaluated in the ED in 10/2023 in the setting of chest pain.  Troponin was negative x 2.  He presents today for follow-up.  Since his last  visit stable overall from a cardiac standpoint.  He continues to note intermittent palpitations, intermittent chest discomfort.  He denies any dizziness, presyncope, syncope, dyspnea, edema, PND, with apnea, weight gain.  He works nights.  Pending repeat labs per PCP.  No recent LDL on file.  He does take Lipitor 80 mg daily.  He is interested in pursuing coronary CT given his  ongoing symptoms.  Will pursue coronary CT angiogram.  Will check echocardiogram.  He will have a be met today.  Follow-up in 6 months, sooner if needed based on test results.  EKG from 10/2023 showed sinus rhythm, 71 bpm.  Paroxysmal tachycardia: Hyperlipidemia: Disposition:  LDL was 165 in 10/2023.  He was not fasting at this time.  Home Medications    Current Outpatient Medications  Medication Sig Dispense Refill   Accu-Chek Softclix Lancets lancets Use as instructed 100 each 1   atorvastatin  (LIPITOR) 80 MG tablet TAKE 1 TABLET BY MOUTH EVERY DAY 90 tablet 1   Blood Glucose Monitoring Suppl (ACCU-CHEK GUIDE) w/Device KIT Use to check blood sugar TID 1 kit 0   glucose blood (ACCU-CHEK GUIDE TEST) test strip USE TO CHECK BLOOD SUGAR 3 TIMES A DAY 100 strip 1   OLANZapine  (ZYPREXA ) 5 MG tablet Take 1 tablet (5 mg total) by mouth daily. Annual exam needed for further refills 90 tablet 1   vitamin B-12 (CYANOCOBALAMIN ) 500 MCG tablet Take 500 mcg by mouth daily.     VITAMIN D  PO Take 500 Units by mouth daily.      valACYclovir  (VALTREX ) 500 MG tablet Take 1 tablet (500 mg total) by mouth 2 (two) times daily. (Patient not taking: Reported on 03/17/2024) 180 tablet 1   Current Facility-Administered Medications  Medication Dose Route Frequency Provider Last Rate Last Admin   0.9 %  sodium chloride  infusion  500 mL Intravenous Once Armbruster, Elspeth SQUIBB, MD         Review of Systems    ***.  All other systems reviewed and are otherwise negative except as noted above.    Physical Exam    VS:  BP 110/60 (BP Location: Right Arm, Patient Position: Sitting, Cuff Size: Normal)   Pulse 68   Ht 5' 6.5 (1.689 m)   Wt 179 lb (81.2 kg)   SpO2 96%   BMI 28.46 kg/m   GEN: Well nourished, well developed, in no acute distress. HEENT: normal. Neck: Supple, no JVD, carotid bruits, or masses. Cardiac: RRR, no murmurs, rubs, or gallops. No clubbing, cyanosis, edema.  Radials/DP/PT 2+ and equal  bilaterally.  Respiratory:  Respirations regular and unlabored, clear to auscultation bilaterally. GI: Soft, nontender, nondistended, BS + x 4. MS: no deformity or atrophy. Skin: warm and dry, no rash. Neuro:  Strength and sensation are intact. Psych: Normal affect.  Accessory Clinical Findings    ECG personally reviewed by me today -    - no acute changes.   Lab Results  Component Value Date   WBC 5.6 10/29/2023   HGB 12.9 (L) 10/29/2023   HCT 39.3 10/29/2023   MCV 80.3 10/29/2023   PLT 234.0 10/29/2023   Lab Results  Component Value Date   CREATININE 1.07 10/29/2023   BUN 6 10/29/2023   NA 138 10/29/2023   K 4.3 10/29/2023   CL 100 10/29/2023   CO2 29 10/29/2023   Lab Results  Component Value Date   ALT 42 10/29/2023   AST 30 10/29/2023   ALKPHOS 53 10/29/2023   BILITOT 1.0  10/29/2023   Lab Results  Component Value Date   CHOL 227 (H) 10/29/2023   HDL 43.00 10/29/2023   LDLCALC 165 (H) 10/29/2023   TRIG 98.0 10/29/2023   CHOLHDL 5 10/29/2023    Lab Results  Component Value Date   HGBA1C 6.0 10/29/2023    Assessment & Plan    1.  ***      Damien JAYSON Braver, NP 03/17/2024, 3:05 PM

## 2024-03-17 NOTE — Patient Instructions (Signed)
 Medication Instructions:  Metoprolol Tartrate 100 mg take 1 tablet 2 hours prior to cardiac CT.  *If you need a refill on your cardiac medications before your next appointment, please call your pharmacy*  Lab Work: BMET today  Testing/Procedures: Your physician has requested that you have an echocardiogram. Echocardiography is a painless test that uses sound waves to create images of your heart. It provides your doctor with information about the size and shape of your heart and how well your heart's chambers and valves are working. This procedure takes approximately one hour. There are no restrictions for this procedure. Please do NOT wear cologne, perfume, aftershave, or lotions (deodorant is allowed). Please arrive 15 minutes prior to your appointment time.  Please note: We ask at that you not bring children with you during ultrasound (echo/ vascular) testing. Due to room size and safety concerns, children are not allowed in the ultrasound rooms during exams. Our front office staff cannot provide observation of children in our lobby area while testing is being conducted. An adult accompanying a patient to their appointment will only be allowed in the ultrasound room at the discretion of the ultrasound technician under special circumstances. We apologize for any inconvenience.    Your cardiac CT will be scheduled at one of the below locations:   [facility] [address]  [place], [place] [address] [phone]  If scheduled at [facility], please arrive at the [facility] and Children's Entrance (Entrance C2) of [facility] 30 minutes prior to test start time. You can use the FREE valet parking offered at entrance C (encouraged to control the heart rate for the test)  Proceed to the [facility] Radiology Department (first floor) to check-in and test prep.  All radiology patients and guests should use entrance C2 at [facility], accessed  from [facility], even though the hospital's physical address listed is [address]  If scheduled at the Heart and Vascular Tower at [address], please enter the parking lot using the [address] entrance and use the FREE valet service at the patient drop-off area. Enter the building and check-in with registration on the main floor.   Please follow these instructions carefully (unless otherwise directed):  An IV will be required for this test and Nitroglycerin  will be given.  Hold all erectile dysfunction medications at least 3 days (72 hrs) prior to test. (Ie viagra, cialis, sildenafil, tadalafil, etc)   On the Night Before the Test: Be sure to Drink plenty of water. Do not consume any caffeinated/decaffeinated beverages or chocolate 12 hours prior to your test. Do not take any antihistamines 12 hours prior to your test.  If the patient has contrast allergy: Patient will need a prescription for Prednisone and very clear instructions (as follows): Prednisone 50 mg - take 13 hours prior to test Take another Prednisone 50 mg 7 hours prior to test Take another Prednisone 50 mg 1 hour prior to test Take Benadryl 50 mg 1 hour prior to test Patient must complete all four doses of above prophylactic medications. Patient will need a ride after test due to Benadryl.  On the Day of the Test: Drink plenty of water until 1 hour prior to the test. Do not eat any food 1 hour prior to test. You may take your regular medications prior to the test.  Take metoprolol (Lopressor) two hours prior to test. If you take Furosemide/Hydrochlorothiazide/Spironolactone/Chlorthalidone, please HOLD on the morning of  the test. Patients who wear a continuous glucose monitor MUST remove the device prior to scanning.       After the Test: Drink plenty of water. After receiving IV contrast, you may experience a mild flushed feeling. This is normal. On occasion, you may experience a  mild rash up to 24 hours after the test. This is not dangerous. If this occurs, you can take Benadryl 25 mg, Zyrtec, Claritin, or Allegra and increase your fluid intake. (Patients taking Tikosyn should avoid Benadryl, and may take Zyrtec, Claritin, or Allegra) If you experience trouble breathing, this can be serious. If it is severe call 911 IMMEDIATELY. If it is mild, please call our office.  We will call to schedule your test 2-4 weeks out understanding that some insurance companies will need an authorization prior to the service being performed.   For more information and frequently asked questions, please visit our website : [email or web address]  For non-scheduling related questions, please contact the cardiac imaging nurse navigator should you have any questions/concerns: Cardiac Imaging Nurse Navigators Direct Office Dial: [phone]   For scheduling needs, including cancellations and rescheduling, please call [place], [phone].   Follow-Up: At [facility], you and your health needs are our priority.  As part of our continuing mission to provide you with exceptional heart care, our providers are all part of one team.  This team includes your primary Cardiologist (physician) and Advanced Practice Providers or APPs (Physician Assistants and Nurse Practitioners) who all work together to provide you with the care you need, when you need it.  Your next appointment:   6 month(s)  Provider:   Alm Clay, MD    We recommend signing up for the patient portal called MyChart.  Sign up information is provided on this After Visit Summary.  MyChart is used to connect with patients for Virtual Visits (Telemedicine).  Patients are able to view lab/test results, encounter notes, upcoming appointments, etc.  Non-urgent messages can be sent to your provider as well.   To learn more about what you can do with MyChart, go to [email or web address].   Other  Instructions

## 2024-03-18 ENCOUNTER — Ambulatory Visit (INDEPENDENT_AMBULATORY_CARE_PROVIDER_SITE_OTHER): Admitting: Podiatry

## 2024-03-18 ENCOUNTER — Encounter: Payer: Self-pay | Admitting: Podiatry

## 2024-03-18 ENCOUNTER — Ambulatory Visit (INDEPENDENT_AMBULATORY_CARE_PROVIDER_SITE_OTHER)

## 2024-03-18 ENCOUNTER — Ambulatory Visit: Payer: Self-pay | Admitting: Nurse Practitioner

## 2024-03-18 DIAGNOSIS — M778 Other enthesopathies, not elsewhere classified: Secondary | ICD-10-CM

## 2024-03-18 DIAGNOSIS — M2041 Other hammer toe(s) (acquired), right foot: Secondary | ICD-10-CM

## 2024-03-18 DIAGNOSIS — D169 Benign neoplasm of bone and articular cartilage, unspecified: Secondary | ICD-10-CM | POA: Diagnosis not present

## 2024-03-18 LAB — BASIC METABOLIC PANEL WITH GFR
BUN/Creatinine Ratio: 10 (ref 9–20)
BUN: 12 mg/dL (ref 6–24)
CO2: 24 mmol/L (ref 20–29)
Calcium: 9.4 mg/dL (ref 8.7–10.2)
Chloride: 98 mmol/L (ref 96–106)
Creatinine, Ser: 1.22 mg/dL (ref 0.76–1.27)
Glucose: 94 mg/dL (ref 70–99)
Potassium: 4.6 mmol/L (ref 3.5–5.2)
Sodium: 135 mmol/L (ref 134–144)
eGFR: 74 mL/min/1.73 (ref >59)

## 2024-03-20 NOTE — Progress Notes (Signed)
 Subjective:   Patient ID: Justin Sanchez, male   DOB: 48 y.o.   MRN: [id number]   HPI Patient presents stating he has a lot of pain in his fifth digit right foot and he wants to get it fixed.  Patient states that he has been trying to shave and clip it it is not effective   ROS      Objective:  Physical Exam  Neurovascular status intact with significant rotation fifth digit right foot in the transverse and partial sagittal plane with also a distal lateral keratotic lesion fifth digit right painful     Assessment:  Hammertoe deformity digit 5 right along with distal lateral exostosis     Plan:  H&P condition reviewed x-ray taken discussed great length.  I have recommended derotational arthroplasty and I explained this to be done in the transverse plane and distal lateral exostectomy with removal of spur and skin.  Patient wants this done I allowed him to read consent form going over all possible complications alternative treatments and that total recovery period will take around 3 months.  Patient scheduled for outpatient surgery is encouraged to call us  with any questions concerns prior to procedure  X-rays indicate severe rotation digit right mostly at the middle phalanx with spur on the distal lateral phalanx

## 2024-03-21 ENCOUNTER — Telehealth: Payer: Self-pay | Admitting: Podiatry

## 2024-03-21 DIAGNOSIS — Z0279 Encounter for issue of other medical certificate: Secondary | ICD-10-CM

## 2024-03-21 NOTE — Telephone Encounter (Signed)
 Called patient to confirm 04/19/24 surgery date. Pt not on any blood thinners or GLP1's. [facility] is [facility]. Patient has received blue surgical bag and is aware that [facility] will call pt 24- 48 hr prior to surgery with arrival time.

## 2024-03-22 ENCOUNTER — Telehealth (HOSPITAL_COMMUNITY): Payer: Self-pay | Admitting: Emergency Medicine

## 2024-03-22 ENCOUNTER — Encounter: Payer: Self-pay | Admitting: Podiatry

## 2024-03-22 ENCOUNTER — Encounter: Payer: Self-pay | Admitting: Nurse Practitioner

## 2024-03-22 NOTE — Telephone Encounter (Signed)
 s/w pt and he is going to ck with HR on if he will get paiid if he is out until approx 05/16/24. I adv we do 8-12 weeks based on his healing/recovery. he will call me back and let me now how he wants to be out. his last day at work will be 04/17/24 DOS 11/4

## 2024-03-22 NOTE — Telephone Encounter (Signed)
 cld pt bk.leave 04/20/24-05/18/24. He said he may want to go back sooner, will call me. DOS 04/19/24. will fax [phone] and email [email or web address]

## 2024-03-22 NOTE — Telephone Encounter (Signed)
 Reaching out to patient to offer assistance regarding upcoming cardiac imaging study; pt verbalizes understanding of appt date/time, parking situation and where to check in, pre-test NPO status and medications ordered, and verified current allergies; name and call back number provided for further questions should they arise Rockwell Alexandria RN Navigator Cardiac Imaging [facility] [phone] office [phone] cell

## 2024-03-23 ENCOUNTER — Ambulatory Visit (HOSPITAL_COMMUNITY)
Admission: RE | Admit: 2024-03-23 | Discharge: 2024-03-23 | Disposition: A | Discharge status: Home / Self Care | Source: Ambulatory Visit | Attending: Cardiology | Admitting: Cardiology

## 2024-03-23 DIAGNOSIS — I479 Paroxysmal tachycardia, unspecified: Secondary | ICD-10-CM | POA: Diagnosis not present

## 2024-03-23 DIAGNOSIS — R072 Precordial pain: Secondary | ICD-10-CM | POA: Insufficient documentation

## 2024-03-23 MED ORDER — IOHEXOL 350 MG/ML SOLN
100.0000 mL | Freq: Once | INTRAVENOUS | Status: AC | PRN
  Administered 2024-03-23: 100 mL via INTRAVENOUS

## 2024-03-23 MED ORDER — NITROGLYCERIN 0.4 MG SL SUBL
0.8000 mg | SUBLINGUAL_TABLET | Freq: Once | SUBLINGUAL | Status: AC
  Administered 2024-03-23: 0.8 mg via SUBLINGUAL

## 2024-03-24 ENCOUNTER — Ambulatory Visit: Admitting: Podiatry

## 2024-03-30 ENCOUNTER — Telehealth: Payer: Self-pay | Admitting: Podiatry

## 2024-03-30 NOTE — Telephone Encounter (Signed)
 Per [employer] portal, surgery scheduled for 04/19/2024 has been denied due to [facility] being out of network. Please advise with next steps.

## 2024-04-01 NOTE — Telephone Encounter (Signed)
 Doesn't make sense. Call surgical center, he should be covered. If not refer to [facility] for surgery

## 2024-04-06 NOTE — Telephone Encounter (Signed)
 Contacted [facility] in regards to facility showing up as OON for pts insurance [facility]). Spoke to [place] and stated that [facility] is in network. She contacted [facility] and was able to confirm that facility is in network. She was given two options by [facility], schedule a peer-to-peer or resubmit the authorization. Authorization was resubmitted with the facility showing in network. New reference # for case is #[id number].

## 2024-04-07 ENCOUNTER — Telehealth: Payer: Self-pay | Admitting: Podiatry

## 2024-04-07 NOTE — Telephone Encounter (Signed)
 DOS- 04/19/2024  HAMMERTOE REPAIR 5TH RT- 28285 EXOSTECTOMY 5TH RT- 28108  [facility] EFFECTIVE DATE- 06/17/2023  DEDUCTIBLE- $500 REMAINING- $0 OOP- $3000 REMAINING- $8363.16 FAMILY DEDUCTIBLE- $1000 REMAINING- $0 FAMILY OOP- $6000 REMAINING- $1927.29 COINSURANCE- 20%  PER [employer] PORTAL, PRIOR AUTH FOR CPT CODES 71714 AND [id number] HAVE BEEN APPROVED FROM 04/19/2024-07/18/2024. AUTH# [id number]

## 2024-04-18 MED ORDER — HYDROCODONE-ACETAMINOPHEN 10-325 MG PO TABS: 1.0000 | ORAL_TABLET | Freq: Three times a day (TID) | ORAL | 0 refills | Status: AC | PRN

## 2024-04-18 NOTE — Addendum Note (Signed)
 Addended by: MAGDALEN PASCO RAMAN on: 04/18/2024 05:02 PM   Modules accepted: Orders

## 2024-04-19 ENCOUNTER — Encounter: Payer: Self-pay | Admitting: Podiatry

## 2024-04-19 DIAGNOSIS — M2041 Other hammer toe(s) (acquired), right foot: Secondary | ICD-10-CM | POA: Diagnosis not present

## 2024-04-19 DIAGNOSIS — M25774 Osteophyte, right foot: Secondary | ICD-10-CM | POA: Diagnosis not present

## 2024-04-19 NOTE — Telephone Encounter (Signed)
 cld pat to get his daughter's name- Justin Sanchez and adv I would email him note for her and him via email on file

## 2024-04-19 NOTE — Telephone Encounter (Signed)
 The pt req a note for him and his daughter due to his surgery(medical condition) be emailed to him at email on file

## 2024-04-21 ENCOUNTER — Ambulatory Visit: Admitting: Emergency Medicine

## 2024-04-25 ENCOUNTER — Ambulatory Visit: Admitting: Podiatry

## 2024-04-25 ENCOUNTER — Ambulatory Visit (INDEPENDENT_AMBULATORY_CARE_PROVIDER_SITE_OTHER)

## 2024-04-25 VITALS — BP 143/76 | HR 65 | Temp 97.5°F

## 2024-04-25 DIAGNOSIS — M2041 Other hammer toe(s) (acquired), right foot: Secondary | ICD-10-CM | POA: Diagnosis not present

## 2024-04-25 NOTE — Patient Instructions (Signed)

## 2024-04-25 NOTE — Progress Notes (Unsigned)
 Patient presents for post-op visit today, POV #1 DOS 04/19/2024 RT 5TH HAMMER TOE REPAIR, RT 5TH EXOSTECTOMY  Everything has been good, not too painful. Not being able to wash it is the only problem. Got a cover to shower, but not able to wash the foot..  RN Notes: n/a  Vital Signs: Today's Vitals   04/25/24 1003  BP: (!) 143/76  Pulse: 65  Temp: (!) 97.5 F (36.4 C)  TempSrc: Oral  PainSc: 4   PainLoc: Toe      Radiographs: [x]  Taken []  Not taken  Surgical Site Assessment:  - Dressing:  [x]  Minimal dry blood, intact []  Reinforced   [x]  Changed     -RN Notes: n/a  - Incision:  [x]  CDI (clean, dry, intact)  [x]  Mild erythema  []  Drainage noted   -RN Notes: n/a  - Swelling:  []  None  [x]  Mild  []  Moderate   []  Significant     -RN Notes: n/a  - Bruising:  []  None  [x]  Present: on toe.   - Sutures/Staples:  []  None [x]  Intact  []  Removed Today  [x]  Plan to remove at next visit   -Cast/Splint/Pins: [x]  None []  Intact []  Removed Today []  Plan to remove at next visit []  Replaced  -Signs of infection:  [x]  None  []  Present - Describe: n/a  -DME:    []  None []  AFW [x]  Surgical shoe []  Cast  []  Splint  -Walking status:  [x]  Full WB  []  Partial WB  []  NWB  -Utilizing device:  [x]  None []  Knee Scooter []  Crutches []  Wheelchair    DVT assessment:  [x]  Denies symptoms []  Chest pain/SOB []  Pain in calf/redness/warmth   Redressed DSD and ace wrap. Educated on signs of infection, proper dressing care, pain management, and weight bearing status. Patient will contact provider with any new or worsening symptoms. The provider assessed the patient today and reviewed instructions regarding plan of care.

## 2024-04-26 NOTE — Progress Notes (Signed)
 Subjective:   Patient ID: Justin Sanchez, male   DOB: 48 y.o.   MRN: [id number]   HPI Patient presents overall doing very well minimal discomfort   ROS      Objective:  Physical Exam  Vascular status intact fifth digit in good alignment stitches well coapted with reduced deformity noted and distal incision healing well     Assessment:  Doing well post arthroplasty exostectomy right     Plan:  H&P x-ray taken reviewed and patient may continue to walk on his foot begin to get it wet and just monitor for the next couple weeks.  Reappoint 2 weeks suture removal  X-rays indicate satisfactory resection of bone overall good alignment

## 2024-05-03 ENCOUNTER — Ambulatory Visit (HOSPITAL_COMMUNITY)
Admission: RE | Admit: 2024-05-03 | Discharge: 2024-05-03 | Disposition: A | Discharge status: Home / Self Care | Source: Ambulatory Visit | Attending: Cardiology | Admitting: Cardiology

## 2024-05-03 DIAGNOSIS — I479 Paroxysmal tachycardia, unspecified: Secondary | ICD-10-CM | POA: Insufficient documentation

## 2024-05-03 LAB — ECHOCARDIOGRAM COMPLETE
Area-P 1/2: 3.77 cm2
S' Lateral: 2.6 cm

## 2024-05-04 ENCOUNTER — Other Ambulatory Visit (HOSPITAL_COMMUNITY)

## 2024-05-04 NOTE — Telephone Encounter (Signed)
 Official Report: Important words in bold italics.  1. Left ventricular ejection fraction, by estimation, is 60 to 65%. The left ventricle has normal function. The left ventricle has no regional wall motion abnormalities. Left ventricular diastolic parameters were normal. The average left ventricular global longitudinal strain is -13.5 %. The global longitudinal strain is  abnormal.   2. Right ventricular systolic function is normal. The right ventricular size is normal.   3. The mitral valve is grossly normal. Trivial mitral valve regurgitation. No evidence of mitral stenosis.   4. The aortic valve is tricuspid (normal). Aortic valve regurgitation is not visualized. No aortic stenosis is present.   5. The inferior vena cava is normal in size with greater than 50% respiratory variability, suggesting right atrial pressure of 3 mmHg. (Normal)  I think Damien Braver, NP reported the results earlier today -- >  In the simplest of terms -- NORMAL study - normal heart function, normal valves. Ejection Fraction is the percent of blood that the heart fills up with that gets pumped out (normal range is 50-75%).  Nothing to explain symptoms.    Alm Clay, MD

## 2024-05-09 ENCOUNTER — Ambulatory Visit: Admitting: Podiatry

## 2024-05-09 ENCOUNTER — Ambulatory Visit (INDEPENDENT_AMBULATORY_CARE_PROVIDER_SITE_OTHER)

## 2024-05-09 DIAGNOSIS — M2041 Other hammer toe(s) (acquired), right foot: Secondary | ICD-10-CM | POA: Diagnosis not present

## 2024-05-09 NOTE — Progress Notes (Unsigned)
 Patient presents for post-op visit today, POV #2 DOS 04/19/2024 RT 5TH HAMMER TOE REPAIR, RT 5TH EXOSTECTOMY  Still hurts a little, other than that doing well. Pain is more annoying than anything. Using triple antibiotic ointment to keep it from drying up..  RN Notes: n/a  Vital Signs: Today's Vitals   05/09/24 1037  PainSc: 0-No pain      Radiographs: [x]  Taken []  Not taken  Surgical Site Assessment:  - Dressing:  []  Minimal dry blood, intact []  Reinforced   []  Changed     -RN Notes: Np dressing in place.   - Incision:  [x]  CDI (clean, dry, intact)  []  Mild erythema  []  Drainage noted   -RN Notes: n/a  - Swelling:  []  None  [x]  Mild  []  Moderate   []  Significant     -RN Notes: n/a  - Bruising:  [x]  None  []  Present: n/a   - Sutures/Staples:  []  None [x]  Intact  [x]  Removed Today  []  Plan to remove at next visit   -Cast/Splint/Pins: [x]  None []  Intact []  Removed Today []  Plan to remove at next visit []  Replaced  -Signs of infection:  [x]  None  []  Present - Describe: n/a  -DME:    []  None []  AFW [x]  Surgical shoe []  Cast  []  Splint  -Walking status:  [x]  Full WB  []  Partial WB  []  NWB  -Utilizing device:  [x]  None []  Knee Scooter []  Crutches []  Wheelchair    DVT assessment:  [x]  Denies symptoms []  Chest pain/SOB []  Pain in calf/redness/warmth   Redressed DSD and ace wrap. Educated on signs of infection, proper dressing care, pain management, and weight bearing status. Patient will contact provider with any new or worsening symptoms. The provider assessed the patient today and reviewed instructions regarding plan of care.

## 2024-05-10 ENCOUNTER — Encounter: Payer: Self-pay | Admitting: Podiatry

## 2024-05-10 NOTE — Telephone Encounter (Signed)
 Emailing pt letter RTW with no restrictions on 05/18/24 per pt's request to email on file

## 2024-05-10 NOTE — Telephone Encounter (Signed)
 pt lft mess about RTW. I cld him and adv we have 05/18/24 as his RTW. I adv if note is ok I can send, if there is a form fee is 25 to be completed. He will ck with HR and call me bk

## 2024-05-11 ENCOUNTER — Encounter: Payer: Self-pay | Admitting: Adult Health

## 2024-05-11 ENCOUNTER — Ambulatory Visit: Admitting: Adult Health

## 2024-05-11 ENCOUNTER — Encounter: Payer: Self-pay | Admitting: Podiatry

## 2024-05-11 VITALS — BP 126/80 | HR 78 | Temp 97.8°F | Ht 66.5 in | Wt 188.0 lb

## 2024-05-11 DIAGNOSIS — R7303 Prediabetes: Secondary | ICD-10-CM | POA: Diagnosis not present

## 2024-05-11 DIAGNOSIS — Z6829 Body mass index (BMI) 29.0-29.9, adult: Secondary | ICD-10-CM

## 2024-05-11 DIAGNOSIS — E663 Overweight: Secondary | ICD-10-CM

## 2024-05-11 LAB — POCT GLYCOSYLATED HEMOGLOBIN (HGB A1C): Hemoglobin A1C: 5.9 % — AB (ref 4.0–5.6)

## 2024-05-11 NOTE — Progress Notes (Signed)
 Subjective:    Patient ID: Justin Sanchez, male    DOB: 27-Aug-1975, 48 y.o.   MRN: [id number]  HPI 48 year old male who  has a past medical history of Anemia, Chicken pox, Depression, Drug abuse (HCC), Hyperlipidemia, and Schizophrenia (HCC).  Discussed the use of AI scribe software for clinical note transcription with the patient, who gave verbal consent to proceed.  History of Present Illness   Justin Sanchez is a 48 year old male who presents for follow-up regarding prediabetes.   His A1c was 6.0, six months ago. He has changed his diet from vegetarian to high red meat diet with occasional fruits and vegetables. He has gained about 18 pounds since his last visit, now 188 pounds, and has not exercised for the past three to four weeks due to hammer toe surgery, interrupting his prior walking routine of three to six miles daily.        Review of Systems See HPI   Past Medical History:  Diagnosis Date   Anemia    Iron deficinecy - taking iron   Chicken pox    Depression    Long time ago - denies currently for 10 years   Drug abuse (HCC)    Hyperlipidemia    Schizophrenia (HCC)    2002 - well controlled    Social History   Socioeconomic History   Marital status: Single    Spouse name: [name]   Number of children: 1   Years of education: 14   Highest education level: GED or equivalent  Occupational History   Occupation: Other    Comment: Works Lobbyist  Tobacco Use   Smoking status: Former    Current packs/day: 1.50    Average packs/day: 1.5 packs/day for 6.0 years (9.0 ttl pk-yrs)    Types: Cigarettes   Smokeless tobacco: Never  Vaping Use   Vaping status: Never Used  Substance and Sexual Activity   Alcohol use: Yes    Alcohol/week: 2.0 standard drinks of alcohol    Types: 2 Cans of beer per week   Drug use: Not Currently    Types: Marijuana    Comment: none since 2002   Sexual activity: Yes    Birth control/protection: Condom  Other  Topics Concern   Not on file  Social History Narrative   Born in [place], [place]   Raised [place], [place]   Completed some college   Family brought to [place]       Cycle and exercise, read,    Diet: eats a little bit of everything, stays away from fried food - eats lots of vegetables.    Social Drivers of Corporate Investment Banker Strain: Low Risk  (02/11/2022)   Overall Financial Resource Strain (CARDIA)    Difficulty of Paying Living Expenses: Not hard at all  Food Insecurity: No Food Insecurity (02/11/2022)   Hunger Vital Sign    Worried About Running Out of Food in the Last Year: Never true    Ran Out of Food in the Last Year: Never true  Transportation Needs: No Transportation Needs (02/11/2022)   PRAPARE - Administrator, Civil Service (Medical): No    Lack of Transportation (Non-Medical): No  Physical Activity: Sufficiently Active (02/11/2022)   Exercise Vital Sign    Days of Exercise per Week: 6 days    Minutes of Exercise per Session: 150+ min  Stress: No Stress Concern Present (02/11/2022)   [employer] of Occupational Health -  Occupational Stress Questionnaire    Feeling of Stress : Not at all  Social Connections: Unknown (02/11/2022)   Social Connection and Isolation Panel    Frequency of Communication with Friends and Family: Three times a week    Frequency of Social Gatherings with Friends and Family: Once a week    Attends Religious Services: Patient declined    Database Administrator or Organizations: No    Attends Engineer, Structural: Not on file    Marital Status: Divorced  Catering Manager Violence: Not on file    Past Surgical History:  Procedure Laterality Date   CYST EXCISION Right    right hand    Family History  Problem Relation Age of Onset   Prostate cancer Maternal Uncle     No Known Allergies  Current Outpatient Medications on File Prior to Visit  Medication Sig Dispense Refill   Accu-Chek Softclix Lancets  lancets Use as instructed 100 each 1   atorvastatin  (LIPITOR) 80 MG tablet TAKE 1 TABLET BY MOUTH EVERY DAY 90 tablet 1   Blood Glucose Monitoring Suppl (ACCU-CHEK GUIDE) w/Device KIT Use to check blood sugar TID 1 kit 0   glucose blood (ACCU-CHEK GUIDE TEST) test strip USE TO CHECK BLOOD SUGAR 3 TIMES A DAY 100 strip 1   OLANZapine  (ZYPREXA ) 5 MG tablet Take 1 tablet (5 mg total) by mouth daily. Annual exam needed for further refills 90 tablet 1   oxyCODONE-acetaminophen  (PERCOCET/ROXICET) 5-325 MG tablet Take 1 tablet by mouth every 4 (four) hours as needed.     Current Facility-Administered Medications on File Prior to Visit  Medication Dose Route Frequency Provider Last Rate Last Admin   0.9 %  sodium chloride  infusion  500 mL Intravenous Once Armbruster, Elspeth SQUIBB, MD        BP 126/80   Pulse 78   Temp 97.8 F (36.6 C) (Oral)   Ht 5' 6.5 (1.689 m)   Wt 188 lb (85.3 kg)   SpO2 97%   BMI 29.89 kg/m       Objective:   Physical Exam Vitals and nursing note reviewed.  Constitutional:      Appearance: Normal appearance.  Cardiovascular:     Rate and Rhythm: Normal rate and regular rhythm.     Pulses: Normal pulses.     Heart sounds: Normal heart sounds.  Pulmonary:     Effort: Pulmonary effort is normal.     Breath sounds: Normal breath sounds.  Skin:    General: Skin is warm and dry.     Capillary Refill: Capillary refill takes less than 2 seconds.  Neurological:     General: No focal deficit present.     Mental Status: He is oriented to person, place, and time.  Psychiatric:        Mood and Affect: Mood normal.        Behavior: Behavior normal.        Thought Content: Thought content normal.        Judgment: Judgment normal.        Assessment & Plan:  Assessment and Plan    Prediabetes A1c decreased to 5.9, indicating well-managed prediabetes. Weight gain likely due to decreased activity and dietary habits. - Continue low carbohydrate and low sugar diet. -  Increase physical activity, including aerobic exercises such as walking. - Recheck A1c in six months.  Overweight Weight increased by 18 pounds, currently 188 pounds. Regular weight is 180-185 pounds. High meat consumption and limited  fruits and vegetables noted. - Aim to reduce weight to 175 pounds by next visit. - Incorporate more fruits and vegetables into diet. - Limit meat consumption, focus on chicken, fish, turkey, occasional red meat and pork.      I personally spent a total of 31 minutes in the care of the patient today including preparing to see the patient, getting/reviewing separately obtained history, performing a medically appropriate exam/evaluation, counseling and educating, and documenting clinical information in the EHR.

## 2024-05-12 NOTE — Progress Notes (Signed)
 Subjective:   Patient ID: Justin Sanchez, male   DOB: 48 y.o.   MRN: [id number]   HPI Patient seen by nurse   ROS      Objective:  Physical Exam  Stitches intact good alignment     Assessment:  Doing well     Plan:  Stitches removed

## 2024-05-18 NOTE — Telephone Encounter (Signed)
 Pt emailed form from benefit request. I faxed [phone] and there was no charge just needed RTW info.

## 2024-05-22 ENCOUNTER — Other Ambulatory Visit: Payer: Self-pay | Admitting: Adult Health
